# Patient Record
Sex: Female | Born: 1950 | Race: White | Hispanic: No | Marital: Married | Smoking: Former smoker
Health system: Southern US, Community
[De-identification: ages and names within clinical notes are randomized; demographics above are authoritative.]

## PROBLEM LIST (undated history)

## (undated) DIAGNOSIS — F32A Depression, unspecified: Secondary | ICD-10-CM

## (undated) DIAGNOSIS — K219 Gastro-esophageal reflux disease without esophagitis: Secondary | ICD-10-CM

## (undated) DIAGNOSIS — H269 Unspecified cataract: Secondary | ICD-10-CM

## (undated) DIAGNOSIS — Z800 Family history of malignant neoplasm of digestive organs: Secondary | ICD-10-CM

## (undated) DIAGNOSIS — Z803 Family history of malignant neoplasm of breast: Secondary | ICD-10-CM

## (undated) DIAGNOSIS — M1990 Unspecified osteoarthritis, unspecified site: Secondary | ICD-10-CM

## (undated) DIAGNOSIS — Z801 Family history of malignant neoplasm of trachea, bronchus and lung: Secondary | ICD-10-CM

## (undated) DIAGNOSIS — C50919 Malignant neoplasm of unspecified site of unspecified female breast: Secondary | ICD-10-CM

## (undated) DIAGNOSIS — D649 Anemia, unspecified: Secondary | ICD-10-CM

## (undated) DIAGNOSIS — I499 Cardiac arrhythmia, unspecified: Secondary | ICD-10-CM

## (undated) DIAGNOSIS — I498 Other specified cardiac arrhythmias: Secondary | ICD-10-CM

## (undated) HISTORY — DX: Malignant neoplasm of unspecified site of unspecified female breast: C50.919

## (undated) HISTORY — DX: Family history of malignant neoplasm of trachea, bronchus and lung: Z80.1

## (undated) HISTORY — DX: Unspecified cataract: H26.9

## (undated) HISTORY — DX: Family history of malignant neoplasm of digestive organs: Z80.0

## (undated) HISTORY — PX: EYE SURGERY: SHX253

## (undated) HISTORY — DX: Gastro-esophageal reflux disease without esophagitis: K21.9

## (undated) HISTORY — PX: TONSILLECTOMY: SUR1361

## (undated) HISTORY — DX: Family history of malignant neoplasm of breast: Z80.3

## (undated) HISTORY — DX: Depression, unspecified: F32.A

## (undated) HISTORY — PX: ROTATOR CUFF REPAIR: SHX139

## (undated) HISTORY — PX: OTHER SURGICAL HISTORY: SHX169

## (undated) HISTORY — PX: HERNIA REPAIR: SHX51

---

## 2017-12-16 HISTORY — PX: REPOSITION OF LENS: SHX6069

## 2021-02-13 DIAGNOSIS — C50911 Malignant neoplasm of unspecified site of right female breast: Secondary | ICD-10-CM | POA: Insufficient documentation

## 2021-05-17 NOTE — Progress Notes (Signed)
Patient called with referral to Radiation Oncology.  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  Phoned patient to define plan of care.  States she is planing to have surgery in [place], but would like to have radiation here.  Discussed Medical Oncology plan with patient.  She will be discussing treatment with her [place] provider tomorrow. Requested full notes and pathology to be faxed here in order to  schedule necessary consults.

## 2021-05-21 NOTE — Progress Notes (Signed)
Patient diagnosed with infiltrating ductal carcinoma 04/24/21 in [place].  She is having lumpectomy in July, in [place], and plans to have adjuvant treatment here at [facility].  She will call with date of return in order to schedule Med/Onc and Rad/Onc appointments. Records sent to be scanned into Media.

## 2021-06-19 DIAGNOSIS — N6342 Unspecified lump in left breast, subareolar: Secondary | ICD-10-CM | POA: Insufficient documentation

## 2021-07-16 DIAGNOSIS — Z9889 Other specified postprocedural states: Secondary | ICD-10-CM

## 2021-07-16 HISTORY — DX: Other specified postprocedural states: Z98.890

## 2021-10-02 DIAGNOSIS — C50919 Malignant neoplasm of unspecified site of unspecified female breast: Secondary | ICD-10-CM

## 2021-11-12 ENCOUNTER — Other Ambulatory Visit: Payer: Self-pay

## 2021-11-12 ENCOUNTER — Inpatient Hospital Stay: Payer: Medicare Other

## 2021-11-12 ENCOUNTER — Inpatient Hospital Stay: Payer: Medicare Other | Attending: Oncology | Admitting: Oncology

## 2021-11-12 ENCOUNTER — Encounter: Payer: Self-pay | Admitting: Oncology

## 2021-11-12 VITALS — BP 162/88 | HR 75 | Resp 17 | Wt 185.0 lb

## 2021-11-12 DIAGNOSIS — C50919 Malignant neoplasm of unspecified site of unspecified female breast: Secondary | ICD-10-CM | POA: Diagnosis present

## 2021-11-12 DIAGNOSIS — F32A Depression, unspecified: Secondary | ICD-10-CM | POA: Insufficient documentation

## 2021-11-12 DIAGNOSIS — Z803 Family history of malignant neoplasm of breast: Secondary | ICD-10-CM | POA: Diagnosis not present

## 2021-11-12 DIAGNOSIS — A4902 Methicillin resistant Staphylococcus aureus infection, unspecified site: Secondary | ICD-10-CM | POA: Insufficient documentation

## 2021-11-12 DIAGNOSIS — K219 Gastro-esophageal reflux disease without esophagitis: Secondary | ICD-10-CM | POA: Insufficient documentation

## 2021-11-12 DIAGNOSIS — Z170 Estrogen receptor positive status [ER+]: Secondary | ICD-10-CM | POA: Insufficient documentation

## 2021-11-12 DIAGNOSIS — Z87891 Personal history of nicotine dependence: Secondary | ICD-10-CM | POA: Diagnosis not present

## 2021-11-12 DIAGNOSIS — Z9013 Acquired absence of bilateral breasts and nipples: Secondary | ICD-10-CM | POA: Diagnosis not present

## 2021-11-12 DIAGNOSIS — Z791 Long term (current) use of non-steroidal anti-inflammatories (NSAID): Secondary | ICD-10-CM | POA: Insufficient documentation

## 2021-11-12 LAB — CBC WITH DIFFERENTIAL/PLATELET
Abs Immature Granulocytes: 0.01 10*3/uL (ref 0.00–0.07)
Basophils Absolute: 0.1 10*3/uL (ref 0.0–0.1)
Basophils Relative: 1 %
Eosinophils Absolute: 0.1 10*3/uL (ref 0.0–0.5)
Eosinophils Relative: 2 %
HCT: 36.3 % (ref 36.0–46.0)
Hemoglobin: 11.3 g/dL — ABNORMAL LOW (ref 12.0–15.0)
Immature Granulocytes: 0 %
Lymphocytes Relative: 35 %
Lymphs Abs: 1.7 10*3/uL (ref 0.7–4.0)
MCH: 26.3 pg (ref 26.0–34.0)
MCHC: 31.1 g/dL (ref 30.0–36.0)
MCV: 84.6 fL (ref 80.0–100.0)
Monocytes Absolute: 0.6 10*3/uL (ref 0.1–1.0)
Monocytes Relative: 12 %
Neutro Abs: 2.5 10*3/uL (ref 1.7–7.7)
Neutrophils Relative %: 50 %
Platelets: 299 10*3/uL (ref 150–400)
RBC: 4.29 MIL/uL (ref 3.87–5.11)
RDW: 13.9 % (ref 11.5–15.5)
WBC: 5 10*3/uL (ref 4.0–10.5)
nRBC: 0 % (ref 0.0–0.2)

## 2021-11-12 LAB — COMPREHENSIVE METABOLIC PANEL
ALT: 20 U/L (ref 0–44)
AST: 32 U/L (ref 15–41)
Albumin: 3.9 g/dL (ref 3.5–5.0)
Alkaline Phosphatase: 104 U/L (ref 38–126)
Anion gap: 9 (ref 5–15)
BUN: 22 mg/dL (ref 8–23)
CO2: 26 mmol/L (ref 22–32)
Calcium: 9.2 mg/dL (ref 8.9–10.3)
Chloride: 104 mmol/L (ref 98–111)
Creatinine, Ser: 0.81 mg/dL (ref 0.44–1.00)
GFR, Estimated: >60 mL/min (ref >60)
Glucose, Bld: 84 mg/dL (ref 70–99)
Potassium: 3.9 mmol/L (ref 3.5–5.1)
Sodium: 139 mmol/L (ref 135–145)
Total Bilirubin: 0.1 mg/dL — ABNORMAL LOW (ref 0.3–1.2)
Total Protein: 7.7 g/dL (ref 6.5–8.1)

## 2021-11-12 NOTE — Progress Notes (Signed)
Hematology/Oncology Consult note [phone] [phone]      Patient Care Team: Pcp, No as PCP - General  REFERRING PROVIDER: No ref. provider found  CHIEF COMPLAINTS/REASON FOR VISIT:  Evaluation of history of right breast cancer  HISTORY OF PRESENTING ILLNESS:   Janice Sanford is a  70 y.o.  female with PMH listed below was seen in consultation at the request of  No ref. provider found  for evaluation of right breast cancer  Patient lives in both [place] and [place]. Previous oncology care was in [place].  Reviewed pathology report of her right breast biopsy in May 2022. 04/24/2021, Site 1, 6:00 to 7:00 right breast, atypical ductal hyperplasia.  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 ER 90% positive, PR 90% positive, HER2 IHC negative  Patient has elected to have bilateral mastectomy.  Final mastectomy pathology report was not available to me. Patient has declined adjuvant endocrine therapy.  Menarche 70 years of age, Age of first childbirth Patient has history of hormone replacement therapy for 10+ years.  Currently off. OCP use, remote Denies any chest wall radiation.  Family history is positive for mother who was diagnosed with breast cancer in her 92s, father was diagnosed with lung cancer. No other cancer family history.   Review of Systems  Constitutional:  Negative for appetite change, chills, fatigue and fever.  HENT:   Negative for hearing loss and voice change.   Eyes:  Negative for eye problems.  Respiratory:  Negative for chest tightness and cough.   Cardiovascular:  Negative for chest pain.  Gastrointestinal:  Negative for abdominal distention, abdominal pain and blood in stool.  Endocrine: Negative for hot flashes.  Genitourinary:  Negative for difficulty urinating and frequency.    Musculoskeletal:  Negative for arthralgias.  Skin:  Negative for itching and rash.  Neurological:  Negative for extremity weakness.  Hematological:  Negative for adenopathy.  Psychiatric/Behavioral:  Negative for confusion.    MEDICAL HISTORY:  Past Medical History:  Diagnosis Date   Breast cancer ([place])    Cataract    Depression    GERD (gastroesophageal reflux disease)    S/P bilateral breast lumpectomy 07/2021    SURGICAL HISTORY: Past Surgical History:  Procedure Laterality Date   CESAREAN SECTION     3 c -section   HERNIA REPAIR     2005 and 2013   REPOSITION OF LENS  2019   ROTATOR CUFF REPAIR     2010 & 2012    SOCIAL HISTORY: Social History   Socioeconomic History   Marital status: Married    Spouse name: Not on file   Number of children: Not on file   Years of education: Not on file   Highest education level: Not on file  Occupational History   Not on file  Tobacco Use   Smoking status: Former    Types: Cigarettes    Quit date: 1984    Years since quitting: 38.9   Smokeless tobacco: Former  Substance and Sexual Activity   Alcohol use: Yes    Alcohol/week: 1.0 standard drink    Types: 1 Standard drinks or equivalent per week    Comment: ocassional   Drug use: Not Currently   Sexual activity: Yes  Other Topics Concern   Not on file  Social History Narrative   Not on file   Social Determinants of Health   Financial  Resource Strain: Not on file  Food Insecurity: Not on file  Transportation Needs: Not on file  Physical Activity: Not on file  Stress: Not on file  Social Connections: Not on file  Intimate Partner Violence: Not on file    FAMILY HISTORY: Family History  Problem Relation Age of Onset   Breast cancer Mother    Lung cancer Father    Other Sister    Parkinson's disease Brother     ALLERGIES:  is allergic to clindamycin.  MEDICATIONS:  Current Outpatient Medications  Medication Sig Dispense Refill   amoxicillin (AMOXIL)  500 MG capsule Take by mouth.     celecoxib (CELEBREX) 200 MG capsule celecoxib 200 mg capsule  TAKE 1 CAPSULE (200 MG) BY ORAL ROUTE ONCE DAILY     meloxicam (MOBIC) 15 MG tablet Take 15 mg by mouth daily.     pantoprazole (PROTONIX) 40 MG tablet Take 40 mg by mouth 2 (two) times daily.     venlafaxine XR (EFFEXOR-XR) 150 MG 24 hr capsule venlafaxine ER 150 mg capsule,extended release 24 hr     HYDROcodone-acetaminophen (NORCO) 10-325 MG tablet hydrocodone 10 mg-acetaminophen 325 mg tablet (Patient not taking: Reported on 11/12/2021)     No current facility-administered medications for this visit.     PHYSICAL EXAMINATION: ECOG PERFORMANCE STATUS: 0 - Asymptomatic Vitals:   11/12/21 1150  BP: (!) 162/88  Pulse: 75  Resp: 17  SpO2: 99%   Filed Weights   11/12/21 1150  Weight: 185 lb (83.9 kg)    Physical Exam Constitutional:      General: She is not in acute distress. HENT:     Head: Normocephalic and atraumatic.  Eyes:     General: No scleral icterus. Cardiovascular:     Rate and Rhythm: Normal rate and regular rhythm.     Heart sounds: Normal heart sounds.  Pulmonary:     Effort: Pulmonary effort is normal. No respiratory distress.     Breath sounds: No wheezing.  Abdominal:     General: Bowel sounds are normal. There is no distension.     Palpations: Abdomen is soft.  Musculoskeletal:        General: No deformity. Normal range of motion.     Cervical back: Normal range of motion and neck supple.  Skin:    General: Skin is warm and dry.     Findings: No erythema or rash.  Neurological:     Mental Status: She is alert and oriented to person, place, and time. Mental status is at baseline.     Cranial Nerves: No cranial nerve deficit.     Coordination: Coordination normal.  Psychiatric:        Mood and Affect: Mood normal.   Breast exam is performed in seated and lying down position. Patient is status post bilateral mastectomy with reconstruction. The implant  edges are intact and there is no evidence of any chest wall recurrence. No evidence of bilateral axillary adenopathy    LABORATORY DATA:  I have reviewed the data as listed Lab Results  Component Value Date   WBC 5.0 11/12/2021   HGB 11.3 (L) 11/12/2021   HCT 36.3 11/12/2021   MCV 84.6 11/12/2021   PLT 299 11/12/2021   Recent Labs    11/12/21 1228  NA 139  K 3.9  CL 104  CO2 26  GLUCOSE 84  BUN 22  CREATININE 0.81  CALCIUM 9.2  GFRNONAA >60  PROT 7.7  ALBUMIN 3.9  AST 32  ALT 20  ALKPHOS 104  BILITOT 0.1*   Iron/TIBC/Ferritin/ %Sat No results found for: IRON, TIBC, FERRITIN, IRONPCTSAT    RADIOGRAPHIC STUDIES: I have personally reviewed the radiological images as listed and agreed with the findings in the report. No results found.    ASSESSMENT & PLAN:  1. Invasive carcinoma of breast ([place])   2. Family history of breast cancer    #History of right breast invasive carcinoma, final mastectomy pathology was not available to me. Will obtain from her surgical oncologist clinic. Declined adjuvant endocrine therapy.  We discussed about the rationale and potential side effects. Recommend patient continue follow-up with surgical oncologist in [place]. Recommend patient to follow-up in 6 months.  Check CBC CMP today  Family history of breast cancer and personal history of breast cancer, Discussed with patient and recommend genetic testing.  She agrees.  Refer to Dietitian.  Orders Placed This Encounter  Procedures   CBC with Differential/Platelet    Standing Status:   Future    Number of Occurrences:   1    Standing Expiration Date:   11/12/2022   Comprehensive metabolic panel    Standing Status:   Future    Number of Occurrences:   1    Standing Expiration Date:   11/12/2022   CBC with Differential/Platelet    Standing Status:   Future    Standing Expiration Date:   11/12/2022   Comprehensive metabolic panel    Standing Status:   Future     Standing Expiration Date:   11/12/2022   Ambulatory referral to Genetics    Referral Priority:   Routine    Referral Type:   Consultation    Referral Reason:   Specialty Services Required    Number of Visits Requested:   1    All questions were answered. The patient knows to call the clinic with any problems questions or concerns.   No ref. provider found    Return of visit:  Thank you for this kind referral and the opportunity to participate in the care of this patient. A copy of today's note is routed to referring provider   Earlie Server, MD, PhD [facility] Hematology Oncology 11/12/2021

## 2021-11-12 NOTE — Progress Notes (Signed)
Patient here  to establish care/ at initial oncology appointment, expresses concerns of bilateral breast numbness

## 2021-11-21 ENCOUNTER — Inpatient Hospital Stay: Payer: Medicare Other | Attending: Oncology | Admitting: Licensed Clinical Social Worker

## 2021-11-21 ENCOUNTER — Inpatient Hospital Stay: Payer: Medicare Other

## 2021-11-29 ENCOUNTER — Inpatient Hospital Stay (HOSPITAL_BASED_OUTPATIENT_CLINIC_OR_DEPARTMENT_OTHER): Payer: Medicare Other | Admitting: Licensed Clinical Social Worker

## 2021-11-29 ENCOUNTER — Encounter: Payer: Self-pay | Admitting: Licensed Clinical Social Worker

## 2021-11-29 ENCOUNTER — Other Ambulatory Visit: Payer: Self-pay

## 2021-11-29 ENCOUNTER — Inpatient Hospital Stay: Payer: Medicare Other

## 2021-11-29 DIAGNOSIS — Z800 Family history of malignant neoplasm of digestive organs: Secondary | ICD-10-CM

## 2021-11-29 DIAGNOSIS — Z803 Family history of malignant neoplasm of breast: Secondary | ICD-10-CM

## 2021-11-29 DIAGNOSIS — C50911 Malignant neoplasm of unspecified site of right female breast: Secondary | ICD-10-CM

## 2021-11-29 DIAGNOSIS — Z801 Family history of malignant neoplasm of trachea, bronchus and lung: Secondary | ICD-10-CM

## 2021-11-29 NOTE — Progress Notes (Signed)
REFERRING PROVIDER: Earlie Server, MD [place],  [place] [id number]  PRIMARY PROVIDER:  Pcp, No  PRIMARY REASON FOR VISIT:  1. Infiltrating ductal carcinoma of right female breast ([place])   2. Family history of breast cancer   3. Family history of lung cancer   4. Family history of stomach cancer      HISTORY OF PRESENT ILLNESS:   Janice Sanford, a 70 y.o. female, was seen for a [place] cancer genetics consultation at the request of Dr. Tasia Catchings due to a personal and family history of cancer.  Janice Sanford presents to clinic today to discuss the possibility of a hereditary predisposition to cancer, genetic testing, and to further clarify her future cancer risks, as well as potential cancer risks for family members.   In 2022, at the age of 68, Janice Sanford was diagnosed with invasive ductal carcinoma w/ DCIS of the right breast, ER/PR+, HER2-. The treatment plan included bilateral mastectomy which was completed in [place].   CANCER HISTORY:  Oncology History   No history exists.     RISK FACTORS:  Menarche was at age 67.  First live birth at age 37.  OCP use: yes Ovaries intact: yes.  Hysterectomy: no.  Menopausal status: postmenopausal.  HRT use:  10+  years. Colonoscopy: yes; normal.  Past Medical History:  Diagnosis Date   Breast cancer ([place])    Cataract    Depression    Family history of breast cancer    Family history of lung cancer    Family history of stomach cancer    GERD (gastroesophageal reflux disease)    S/P bilateral breast lumpectomy 07/2021    Past Surgical History:  Procedure Laterality Date   CESAREAN SECTION     3 c -section   HERNIA REPAIR     2005 and 2013   REPOSITION OF LENS  2019   ROTATOR CUFF REPAIR     2010 & 2012    Social History   Socioeconomic History   Marital status: Married    Spouse name: Not on file   Number of children: Not on file   Years of education: Not on file   Highest education level: Not on file   Occupational History   Not on file  Tobacco Use   Smoking status: Former    Types: Cigarettes    Quit date: 1984    Years since quitting: 38.9   Smokeless tobacco: Former  Substance and Sexual Activity   Alcohol use: Yes    Alcohol/week: 1.0 standard drink    Types: 1 Standard drinks or equivalent per week    Comment: ocassional   Drug use: Not Currently   Sexual activity: Yes  Other Topics Concern   Not on file  Social History Narrative   Not on file   Social Determinants of Health   Financial Resource Strain: Not on file  Food Insecurity: Not on file  Transportation Needs: Not on file  Physical Activity: Not on file  Stress: Not on file  Social Connections: Not on file     FAMILY HISTORY:  We obtained a detailed, 4-generation family history.  Significant diagnoses are listed below: Family History  Problem Relation Age of Onset   Breast cancer Mother    Lung cancer Father    Other Sister    Parkinson's disease Brother    Janice Sanford has 3 daughters (40, 29, 9), none have had cancer. She has 2 brothers, 1 sister, and an  adopted brother and sister, none have had cancer.  Janice Sanford's mother had breast cancer at 51 and died at 36, she was an only child. Maternal grandmother had breast cancer at 38 and died at 62. Grandfather died of stroke 77-70.   Janice Sanford's father had lung cancer at 12 and died at 38, history of smoking. Patient had 2 paternal aunts. One died recently at 92 and her son died of stomach cancer at 27. Paternal grandfather died at 63, grandmother died at 60.  Janice Sanford is unaware of previous family history of genetic testing for hereditary cancer risks. Patient's maternal ancestors are of [place] descent, and paternal ancestors are of [place] descent. There is no reported Ashkenazi Jewish ancestry. There is no known consanguinity.   GENETIC COUNSELING ASSESSMENT: Janice Sanford is a 70 y.o. female with a personal and family history of breast  cancer which is somewhat suggestive of a hereditary cancer syndrome and predisposition to cancer. We, therefore, discussed and recommended the following at today's visit.   DISCUSSION: We discussed that approximately 10% of breast cancer is hereditary. Most cases of hereditary breast cancer are associated with BRCA1/BRCA2 genes, although there are other genes associated with hereditary cancer as well. Cancers and risks are gene specific.  We discussed that testing is beneficial for several reasons including knowing about other cancer risks, identifying potential screening and risk-reduction options that may be appropriate, and to understand if other family members could be at risk for cancer and allow them to undergo genetic testing.   We reviewed the characteristics, features and inheritance patterns of hereditary cancer syndromes. We also discussed genetic testing, including the appropriate family members to test, the process of testing, insurance coverage and turn-around-time for results. We discussed the implications of a negative, positive and/or variant of uncertain significant result. We recommended Janice Sanford pursue genetic testing for the [employer] CancerNext-Expanded+RNA gene panel.   The CancerNext-Expanded + RNAinsight gene panel offered by [employer] and includes sequencing and rearrangement analysis for the following 77 genes: IP, ALK, APC*, ATM*, AXIN2, BAP1, BARD1, BLM, BMPR1A, BRCA1*, BRCA2*, BRIP1*, CDC73, CDH1*,CDK4, CDKN1B, CDKN2A, CHEK2*, CTNNA1, DICER1, FANCC, FH, FLCN, GALNT12, KIF1B, LZTR1, MAX, MEN1, MET, MLH1*, MSH2*, MSH3, MSH6*, MUTYH*, NBN, NF1*, NF2, NTHL1, PALB2*, PHOX2B, PMS2*, POT1, PRKAR1A, PTCH1, PTEN*, RAD51C*, RAD51D*,RB1, RECQL, RET, SDHA, SDHAF2, SDHB, SDHC, SDHD, SMAD4, SMARCA4, SMARCB1, SMARCE1, STK11, SUFU, TMEM127, TP53*,TSC1, TSC2, VHL and XRCC2 (sequencing and deletion/duplication); EGFR, EGLN1, HOXB13, KIT, MITF, PDGFRA, POLD1 and POLE (sequencing only); EPCAM  and GREM1 (deletion/duplication only).  Based on Janice Sanford's personal and family history of cancer, she meets medical criteria for genetic testing. Despite that she meets criteria, she may still have an out of pocket cost. We discussed that if her out of pocket cost for testing is over $100, the laboratory will call and confirm whether she wants to proceed with testing.  If the out of pocket cost of testing is less than $100 she will be billed by the genetic testing laboratory.   PLAN: After considering the risks, benefits, and limitations, Janice Sanford provided informed consent to pursue genetic testing and the blood sample was sent to [facility] for analysis of the CancerNext-Expanded+RNA panel. Results should be available within approximately 2-3 weeks' time, at which point they will be disclosed by telephone to Janice Sanford, as will any additional recommendations warranted by these results. Janice Sanford will receive a summary of her genetic counseling visit and a copy of her results once available. This information will also  be available in Epic.   Janice Sanford's questions were answered to her satisfaction today. Our contact information was provided should additional questions or concerns arise. Thank you for the referral and allowing [place] to share in the care of your patient.   Faith Rogue, MS, [facility] Genetic Counselor [email or web address] Phone: [phone]  The patient was seen for a total of 25 minutes in face-to-face genetic counseling.  Patient was seen alone.  Dr. Grayland Ormond was available for discussion regarding this case.   _______________________________________________________________________ For Office Staff:  Number of people involved in session: 1 Was an Intern/ student involved with case: no

## 2021-12-18 ENCOUNTER — Telehealth: Payer: Self-pay | Admitting: Licensed Clinical Social Worker

## 2021-12-25 ENCOUNTER — Encounter: Payer: Self-pay | Admitting: Licensed Clinical Social Worker

## 2021-12-25 ENCOUNTER — Ambulatory Visit: Payer: Self-pay | Admitting: Licensed Clinical Social Worker

## 2021-12-25 DIAGNOSIS — Z1379 Encounter for other screening for genetic and chromosomal anomalies: Secondary | ICD-10-CM

## 2021-12-25 NOTE — Telephone Encounter (Signed)
Revealed negative genetic testing.  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 This normal result is reassuring and indicates that it is unlikely Janice Sanford's cancer is due to a hereditary cause.  It is unlikely that there is an increased risk of another cancer due to a mutation in one of these genes.  However, genetic testing is not perfect, and cannot definitively rule out a hereditary cause.  It will be important for her to keep in contact with genetics to learn if any additional testing may be needed in the future.

## 2021-12-25 NOTE — Progress Notes (Signed)
HPI:  [name] was previously seen in the [facility] clinic due to a personal and family history of cancer and concerns regarding a hereditary predisposition to cancer. Please refer to our prior cancer genetics clinic note for more information regarding our discussion, assessment and recommendations, at the time. [name]'s recent genetic test results were disclosed to her, as were recommendations warranted by these results. These results and recommendations are discussed in more detail below.  CANCER HISTORY:  Oncology History   No history exists.    FAMILY HISTORY:  We obtained a detailed, 4-generation family history.  Significant diagnoses are listed below: Family History  Problem Relation Age of Onset   Breast cancer Mother    Lung cancer Father    Other Sister    Parkinson's disease Brother     [name] has 3 daughters (67, 47, 44), none have had cancer. She has 2 brothers, 1 sister, and an adopted brother and sister, none have had cancer.   [name]'s mother had breast cancer at 75 and died at 60, she was an only child. Maternal grandmother had breast cancer at 59 and died at 34. Grandfather died of stroke 85-70.    [name]'s father had lung cancer at 36 and died at 62, history of smoking. Patient had 2 paternal aunts. One died recently at 71 and her son died of stomach cancer at 41. Paternal grandfather died at 46, grandmother died at 36.   Janice Sanford is unaware of previous family history of genetic testing for hereditary cancer risks. Patient's maternal ancestors are of [place] descent, and paternal ancestors are of German/Irish descent. There is no reported Ashkenazi Jewish ancestry. There is no known consanguinity.     GENETIC TEST RESULTS: Genetic testing reported out on 12/13/2021 through the [facility] CancerNext-Expanded+RNA cancer panel found no pathogenic mutations.   The CancerNext-Expanded + RNAinsight gene panel offered by [employer] and includes sequencing and rearrangement analysis for the following 77 genes: IP, ALK, APC*, ATM*, AXIN2, BAP1, BARD1, BLM, BMPR1A, BRCA1*, BRCA2*, BRIP1*, CDC73, CDH1*,CDK4, CDKN1B, CDKN2A, CHEK2*, CTNNA1, DICER1, FANCC, FH, FLCN, GALNT12, KIF1B, LZTR1, MAX, MEN1, MET, MLH1*, MSH2*, MSH3, MSH6*, MUTYH*, NBN, NF1*, NF2, NTHL1, PALB2*, PHOX2B, PMS2*, POT1, PRKAR1A, PTCH1, PTEN*, RAD51C*, RAD51D*,RB1, RECQL, RET, SDHA, SDHAF2, SDHB, SDHC, SDHD, SMAD4, SMARCA4, SMARCB1, SMARCE1, STK11, SUFU, TMEM127, TP53*,TSC1, TSC2, VHL and XRCC2 (sequencing and deletion/duplication); EGFR, EGLN1, HOXB13, KIT, MITF, PDGFRA, POLD1 and POLE (sequencing only); EPCAM and GREM1 (deletion/duplication only).   The test report has been scanned into EPIC and is located under the Molecular Pathology section of the Results Review tab.  A portion of the result report is included below for reference.      We discussed that because current genetic testing is not perfect, it is possible there may be a gene mutation in one of these genes that current testing cannot detect, but that chance is small.  There could be another gene that has not yet been discovered, or that we have not yet tested, that is responsible for the cancer diagnoses in the family. It is also possible there is a hereditary cause for the cancer in the family that [name] did not inherit and therefore was not identified in her testing.  Therefore, it is important to remain in touch with cancer genetics in the future so that we can continue to offer [name] the most up to date genetic testing.   Genetic testing did identify a variant of uncertain significance (  VUS) in the DICER1 gene called [id number].[id number].  At this time, it is unknown if this variant is associated with increased cancer risk or if this is a normal finding, but most variants such as this get reclassified to being inconsequential. It should not be used to make medical management decisions. With  time, we suspect the lab will determine the significance of this variant, if any. If we do learn more about it we will try to contact [name] to discuss it further. However, it is important to stay in touch with [place] periodically and keep the address and phone number up to date.  ADDITIONAL GENETIC TESTING:We discussed with [name] that her genetic testing was fairly extensive.  If there are genes identified to increase cancer risk that can be analyzed in the future, we would be happy to discuss and coordinate this testing at that time.    CANCER SCREENING RECOMMENDATIONS: [name]'s test result is considered negative (normal).  This means that we have not identified a hereditary cause for her  personal and family history of cancer at this time. Most cancers happen by chance and this negative test suggests that her cancer may fall into this category.    While reassuring, this does not definitively rule out a hereditary predisposition to cancer. It is still possible that there could be genetic mutations that are undetectable by current technology. There could be genetic mutations in genes that have not been tested or identified to increase cancer risk.  Therefore, it is recommended she continue to follow the cancer management and screening guidelines provided by her oncology and primary healthcare provider.   An individual's cancer risk and medical management are not determined by genetic test results alone. Overall cancer risk assessment incorporates additional factors, including personal medical history, family history, and any available genetic information that may result in a personalized plan for cancer prevention and surveillance.  RECOMMENDATIONS FOR FAMILY MEMBERS:  Relatives in this family might be at some increased risk of developing cancer, over the general population risk, simply due to the family history of cancer.  We recommended female relatives in this family have a yearly  mammogram beginning at age 43, or 90 years younger than the earliest onset of cancer, an annual clinical breast exam, and perform monthly breast self-exams. Female relatives in this family should also have a gynecological exam as recommended by their primary provider.  All family members should be referred for colonoscopy starting at age 67.   It is also possible there is a hereditary cause for the cancer in [name]'s family that she did not inherit and therefore was not identified in her.  Based on [name]'s family history, we recommended those related to her maternal grandmother who had breast cancer in her 22s have genetic counseling and testing. [name] will let us know if we can be of any assistance in coordinating genetic counseling and/or testing for these family members.  FOLLOW-UP: Lastly, we discussed with [name] that cancer genetics is a rapidly advancing field and it is possible that new genetic tests will be appropriate for her and/or her family members in the future. We encouraged her to remain in contact with cancer genetics on an annual basis so we can update her personal and family histories and let her know of advances in cancer genetics that may benefit this family.   Our contact number was provided. [name]'s questions were answered to her satisfaction, and she knows she is welcome to  call us at anytime with additional questions or concerns.   Faith Rogue, MS, [facility] Genetic Counselor [email or web address] Phone: [phone]

## 2022-01-02 NOTE — Progress Notes (Signed)
Patient phoned stating she needs cardiac clearance before she can be scheduled for reconstruction  with her surgeon in Dr. Amie Portland in [place].  Message sent to Dr. Tasia Catchings.

## 2022-01-22 ENCOUNTER — Other Ambulatory Visit
Admission: RE | Admit: 2022-01-22 | Discharge: 2022-01-22 | Disposition: A | Discharge status: Home / Self Care | Payer: Medicare Other | Source: Home / Self Care | Attending: *Deleted | Admitting: *Deleted

## 2022-01-22 ENCOUNTER — Other Ambulatory Visit: Payer: Self-pay

## 2022-01-22 ENCOUNTER — Ambulatory Visit
Admission: RE | Admit: 2022-01-22 | Discharge: 2022-01-22 | Disposition: A | Discharge status: Home / Self Care | Payer: Medicare Other | Source: Ambulatory Visit | Attending: Plastic Surgery | Admitting: Plastic Surgery

## 2022-01-22 ENCOUNTER — Ambulatory Visit
Admission: RE | Admit: 2022-01-22 | Discharge: 2022-01-22 | Disposition: A | Discharge status: Home / Self Care | Payer: Medicare Other | Attending: Plastic Surgery | Admitting: Plastic Surgery

## 2022-01-22 ENCOUNTER — Other Ambulatory Visit: Payer: Self-pay | Admitting: Plastic Surgery

## 2022-01-22 DIAGNOSIS — N62 Hypertrophy of breast: Secondary | ICD-10-CM

## 2022-01-22 DIAGNOSIS — C50111 Malignant neoplasm of central portion of right female breast: Secondary | ICD-10-CM

## 2022-01-22 DIAGNOSIS — R21 Rash and other nonspecific skin eruption: Secondary | ICD-10-CM

## 2022-01-22 LAB — CBC
HCT: 37.6 % (ref 36.0–46.0)
Hemoglobin: 11.6 g/dL — ABNORMAL LOW (ref 12.0–15.0)
MCH: 24.4 pg — ABNORMAL LOW (ref 26.0–34.0)
MCHC: 30.9 g/dL (ref 30.0–36.0)
MCV: 79.2 fL — ABNORMAL LOW (ref 80.0–100.0)
Platelets: 356 10*3/uL (ref 150–400)
RBC: 4.75 MIL/uL (ref 3.87–5.11)
RDW: 14.6 % (ref 11.5–15.5)
WBC: 5 10*3/uL (ref 4.0–10.5)
nRBC: 0 % (ref 0.0–0.2)

## 2022-01-22 LAB — BASIC METABOLIC PANEL
Anion gap: 6 (ref 5–15)
BUN: 21 mg/dL (ref 8–23)
CO2: 26 mmol/L (ref 22–32)
Calcium: 9.7 mg/dL (ref 8.9–10.3)
Chloride: 105 mmol/L (ref 98–111)
Creatinine, Ser: 1.06 mg/dL — ABNORMAL HIGH (ref 0.44–1.00)
GFR, Estimated: 57 mL/min — ABNORMAL LOW (ref >60)
Glucose, Bld: 120 mg/dL — ABNORMAL HIGH (ref 70–99)
Potassium: 4.6 mmol/L (ref 3.5–5.1)
Sodium: 137 mmol/L (ref 135–145)

## 2022-05-14 ENCOUNTER — Encounter: Payer: Self-pay | Admitting: Oncology

## 2022-05-14 ENCOUNTER — Inpatient Hospital Stay (HOSPITAL_BASED_OUTPATIENT_CLINIC_OR_DEPARTMENT_OTHER): Payer: Medicare Other | Admitting: Oncology

## 2022-05-14 ENCOUNTER — Inpatient Hospital Stay: Payer: Medicare Other | Attending: Oncology

## 2022-05-14 VITALS — BP 152/58 | HR 70 | Temp 96.7°F | Resp 16 | Wt 163.9 lb

## 2022-05-14 DIAGNOSIS — Z9013 Acquired absence of bilateral breasts and nipples: Secondary | ICD-10-CM | POA: Insufficient documentation

## 2022-05-14 DIAGNOSIS — Z809 Family history of malignant neoplasm, unspecified: Secondary | ICD-10-CM

## 2022-05-14 DIAGNOSIS — N644 Mastodynia: Secondary | ICD-10-CM

## 2022-05-14 DIAGNOSIS — Z801 Family history of malignant neoplasm of trachea, bronchus and lung: Secondary | ICD-10-CM | POA: Diagnosis not present

## 2022-05-14 DIAGNOSIS — Z170 Estrogen receptor positive status [ER+]: Secondary | ICD-10-CM | POA: Diagnosis not present

## 2022-05-14 DIAGNOSIS — C50919 Malignant neoplasm of unspecified site of unspecified female breast: Secondary | ICD-10-CM

## 2022-05-14 DIAGNOSIS — C50911 Malignant neoplasm of unspecified site of right female breast: Secondary | ICD-10-CM | POA: Insufficient documentation

## 2022-05-14 DIAGNOSIS — Z803 Family history of malignant neoplasm of breast: Secondary | ICD-10-CM | POA: Diagnosis not present

## 2022-05-14 LAB — CBC WITH DIFFERENTIAL/PLATELET
Abs Immature Granulocytes: 0.01 10*3/uL (ref 0.00–0.07)
Basophils Absolute: 0 10*3/uL (ref 0.0–0.1)
Basophils Relative: 1 %
Eosinophils Absolute: 0.1 10*3/uL (ref 0.0–0.5)
Eosinophils Relative: 2 %
HCT: 38.1 % (ref 36.0–46.0)
Hemoglobin: 11.9 g/dL — ABNORMAL LOW (ref 12.0–15.0)
Immature Granulocytes: 0 %
Lymphocytes Relative: 45 %
Lymphs Abs: 2.1 10*3/uL (ref 0.7–4.0)
MCH: 25.4 pg — ABNORMAL LOW (ref 26.0–34.0)
MCHC: 31.2 g/dL (ref 30.0–36.0)
MCV: 81.2 fL (ref 80.0–100.0)
Monocytes Absolute: 0.5 10*3/uL (ref 0.1–1.0)
Monocytes Relative: 11 %
Neutro Abs: 1.8 10*3/uL (ref 1.7–7.7)
Neutrophils Relative %: 41 %
Platelets: 208 10*3/uL (ref 150–400)
RBC: 4.69 MIL/uL (ref 3.87–5.11)
RDW: 17.2 % — ABNORMAL HIGH (ref 11.5–15.5)
WBC: 4.5 10*3/uL (ref 4.0–10.5)
nRBC: 0 % (ref 0.0–0.2)

## 2022-05-14 LAB — COMPREHENSIVE METABOLIC PANEL
ALT: 15 U/L (ref 0–44)
AST: 23 U/L (ref 15–41)
Albumin: 4.2 g/dL (ref 3.5–5.0)
Alkaline Phosphatase: 102 U/L (ref 38–126)
Anion gap: 6 (ref 5–15)
BUN: 25 mg/dL — ABNORMAL HIGH (ref 8–23)
CO2: 27 mmol/L (ref 22–32)
Calcium: 9.5 mg/dL (ref 8.9–10.3)
Chloride: 106 mmol/L (ref 98–111)
Creatinine, Ser: 0.97 mg/dL (ref 0.44–1.00)
GFR, Estimated: >60 mL/min (ref >60)
Glucose, Bld: 86 mg/dL (ref 70–99)
Potassium: 4.2 mmol/L (ref 3.5–5.1)
Sodium: 139 mmol/L (ref 135–145)
Total Bilirubin: 0.3 mg/dL (ref 0.3–1.2)
Total Protein: 7.5 g/dL (ref 6.5–8.1)

## 2022-05-14 NOTE — Progress Notes (Unsigned)
Pt in for 6 month follow up, states she is having some pain in outer right breast that is intermittent.  Pt also reports since have implant surgery she has developed some cardiac issues and recently had to wear a holter monitor and has a stress test scheduled for June 14, 2022.

## 2022-05-15 NOTE — Progress Notes (Signed)
Hematology/Oncology Progress note [phone] [phone]         Patient Care Team: Pcp, No as PCP - General  REFERRING PROVIDER: No ref. provider found  CHIEF COMPLAINTS/REASON FOR VISIT:  Evaluation of history of right breast cancer  HISTORY OF PRESENTING ILLNESS:   Janice Sanford is a  71 y.o.  female with PMH listed below was seen in consultation at the request of  No ref. provider found  for evaluation of right breast cancer  Patient lives in both [place] and [place]. Previous oncology care was in [place].  Reviewed pathology report of her right breast biopsy in May 2022. 04/24/2021, Site 1, 6:00 to 7:00 right breast, atypical ductal hyperplasia.  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 ER 90% positive, PR 90% positive, HER2 IHC negative  Patient has elected to have bilateral mastectomy.  Final mastectomy pathology report was not available to me. Patient has declined adjuvant endocrine therapy.  Menarche 71 years of age, Age of first childbirth Patient has history of hormone replacement therapy for 10+ years.  Currently off. OCP use, remote Denies any chest wall radiation.  Family history is positive for mother who was diagnosed with breast cancer in her 91s, father was diagnosed with lung cancer. No other cancer family history.  INTERVAL HISTORY Janice Sanford is a 71 y.o. female who has above history reviewed by me today presents for follow up visit for management of history of right breast cancer. Patient complains right breast sharp pain, intermittent, spontaneously resolves.  She has no other breast complaints.  Denies any new complaints. She is now retired and resides primarily in [place].  She had a telemetry medicine with her surgeon.   03/13/2022 she had bilateral revisions of reconstructed  breasts with exchange of TE for saline implants She also had skin biopsy of the right anterior shoulder and skin of the right upper back.  Pathology showed actinic keratosis, bowenoid, no malignancy in the sections examined.   Review of Systems  Constitutional:  Negative for appetite change, chills, fatigue and fever.  HENT:   Negative for hearing loss and voice change.   Eyes:  Negative for eye problems.  Respiratory:  Negative for chest tightness and cough.   Cardiovascular:  Negative for chest pain.  Gastrointestinal:  Negative for abdominal distention, abdominal pain and blood in stool.  Endocrine: Negative for hot flashes.  Genitourinary:  Negative for difficulty urinating and frequency.   Musculoskeletal:  Negative for arthralgias.  Skin:  Negative for itching and rash.  Neurological:  Negative for extremity weakness.  Hematological:  Negative for adenopathy.  Psychiatric/Behavioral:  Negative for confusion.    MEDICAL HISTORY:  Past Medical History:  Diagnosis Date   Breast cancer ([place])    Cataract    Depression    Family history of breast cancer    Family history of lung cancer    Family history of stomach cancer    GERD (gastroesophageal reflux disease)    S/P bilateral breast lumpectomy 07/2021    SURGICAL HISTORY: Past Surgical History:  Procedure Laterality Date   CESAREAN SECTION     3 c -section   HERNIA REPAIR     2005 and 2013   REPOSITION OF LENS  2019   ROTATOR CUFF REPAIR     2010 & 2012    SOCIAL HISTORY: Social History   Socioeconomic History   Marital  status: Married    Spouse name: Not on file   Number of children: Not on file   Years of education: Not on file   Highest education level: Not on file  Occupational History   Not on file  Tobacco Use   Smoking status: Former    Types: Cigarettes    Quit date: 1984    Years since quitting: 39.4   Smokeless tobacco: Former  Substance and Sexual Activity   Alcohol use: Yes     Alcohol/week: 1.0 standard drink    Types: 1 Standard drinks or equivalent per week    Comment: ocassional   Drug use: Not Currently   Sexual activity: Yes  Other Topics Concern   Not on file  Social History Narrative   Not on file   Social Determinants of Health   Financial Resource Strain: Not on file  Food Insecurity: Not on file  Transportation Needs: Not on file  Physical Activity: Not on file  Stress: Not on file  Social Connections: Not on file  Intimate Partner Violence: Not on file    FAMILY HISTORY: Family History  Problem Relation Age of Onset   Breast cancer Mother    Lung cancer Father    Other Sister    Parkinson's disease Brother     ALLERGIES:  is allergic to clindamycin.  MEDICATIONS:  Current Outpatient Medications  Medication Sig Dispense Refill   celecoxib (CELEBREX) 200 MG capsule celecoxib 200 mg capsule  TAKE 1 CAPSULE (200 MG) BY ORAL ROUTE ONCE DAILY     pantoprazole (PROTONIX) 40 MG tablet Take 40 mg by mouth 2 (two) times daily.     venlafaxine XR (EFFEXOR-XR) 150 MG 24 hr capsule venlafaxine ER 150 mg capsule,extended release 24 hr     No current facility-administered medications for this visit.     PHYSICAL EXAMINATION: ECOG PERFORMANCE STATUS: 0 - Asymptomatic Vitals:   05/14/22 1409  BP: (!) 152/58  Pulse: 70  Resp: 16  Temp: (!) 96.7 F (35.9 C)  SpO2: 100%   Filed Weights   05/14/22 1409  Weight: 163 lb 14.4 oz (74.3 kg)    Physical Exam Constitutional:      General: She is not in acute distress. HENT:     Head: Normocephalic and atraumatic.  Eyes:     General: No scleral icterus. Cardiovascular:     Rate and Rhythm: Normal rate and regular rhythm.     Heart sounds: Normal heart sounds.  Pulmonary:     Effort: Pulmonary effort is normal. No respiratory distress.     Breath sounds: No wheezing.  Abdominal:     General: Bowel sounds are normal. There is no distension.     Palpations: Abdomen is soft.   Musculoskeletal:        General: No deformity. Normal range of motion.     Cervical back: Normal range of motion and neck supple.  Skin:    General: Skin is warm and dry.     Findings: No erythema or rash.  Neurological:     Mental Status: She is alert and oriented to person, place, and time. Mental status is at baseline.     Cranial Nerves: No cranial nerve deficit.     Coordination: Coordination normal.  Psychiatric:        Mood and Affect: Mood normal.   Breast exam is performed in seated and lying down position. Patient is status post bilateral mastectomy with reconstruction. The implant edges are intact and  there is no evidence of any chest wall recurrence. No evidence of bilateral axillary adenopathy    LABORATORY DATA:  I have reviewed the data as listed Lab Results  Component Value Date   WBC 4.5 05/14/2022   HGB 11.9 (L) 05/14/2022   HCT 38.1 05/14/2022   MCV 81.2 05/14/2022   PLT 208 05/14/2022   Recent Labs    11/12/21 1228 01/22/22 1515 05/14/22 1329  NA 139 137 139  K 3.9 4.6 4.2  CL 104 105 106  CO2 '26 26 27  ' GLUCOSE 84 120* 86  BUN 22 21 25*  CREATININE 0.81 1.06* 0.97  CALCIUM 9.2 9.7 9.5  GFRNONAA >60 57* >60  PROT 7.7  --  7.5  ALBUMIN 3.9  --  4.2  AST 32  --  23  ALT 20  --  15  ALKPHOS 104  --  102  BILITOT 0.1*  --  0.3    Iron/TIBC/Ferritin/ %Sat No results found for: IRON, TIBC, FERRITIN, IRONPCTSAT    RADIOGRAPHIC STUDIES: I have personally reviewed the radiological images as listed and agreed with the findings in the report. No results found.    ASSESSMENT & PLAN:  1. Infiltrating ductal carcinoma of right female breast ([place])   2. Family history of cancer   3. Breast pain, right    #History of right breast invasive carcinoma, ER positive, PR positive, HER2 negative.  Status post bilateral mastectomy with reconstruction.  Final mastectomy pathology was not available to me. Declined endocrine therapy. Labs reviewed and  discussed with patient. 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 I recommend patient to establish care with plastic surgeon.  Family history of breast cancer.  Patient has discussed with genetic counselor.  11/29/21 [facility] showed DICER1 VUS.  No other pathologic mutations.  Orders Placed This Encounter  Procedures   CBC with Differential/Platelet    Standing Status:   Future    Standing Expiration Date:   05/15/2023   Comprehensive metabolic panel    Standing Status:   Future    Standing Expiration Date:   05/15/2023   Ambulatory referral to Plastic Surgery    Referral Priority:   Routine    Referral Type:   Surgical    Referral Reason:   Specialty Services Required    Referred to Provider:   Wallace Going, DO    Requested Specialty:   Plastic Surgery    Number of Visits Requested:   1    All questions were answered. The patient knows to call the clinic with any problems questions or concerns.    Return of visit: Follow-up in 6 months. Thank you for this kind referral and the opportunity to participate in the care of this patient. A copy of today's note is routed to referring provider   Earlie Server, MD, PhD [facility] Hematology Oncology 05/15/2022

## 2022-08-20 ENCOUNTER — Encounter: Payer: Self-pay | Admitting: Plastic Surgery

## 2022-08-20 ENCOUNTER — Ambulatory Visit (INDEPENDENT_AMBULATORY_CARE_PROVIDER_SITE_OTHER): Payer: Medicare Other | Admitting: Plastic Surgery

## 2022-08-20 VITALS — BP 149/85 | HR 70 | Ht 70.0 in | Wt 158.0 lb

## 2022-08-20 DIAGNOSIS — C50112 Malignant neoplasm of central portion of left female breast: Secondary | ICD-10-CM | POA: Diagnosis not present

## 2022-08-20 DIAGNOSIS — C50911 Malignant neoplasm of unspecified site of right female breast: Secondary | ICD-10-CM

## 2022-08-20 DIAGNOSIS — N6342 Unspecified lump in left breast, subareolar: Secondary | ICD-10-CM

## 2022-08-20 NOTE — Progress Notes (Signed)
Patient ID: Janice Sanford, female    DOB: [birth year], 71 y.o.   MRN: [id number]   Chief Complaint  Patient presents with   Consult   Breast Problem    The  patient is a 71 yrs old female here for a consultation for breast reconstruction.  She received oncology care in the past in [place] in 2022 for a well differentiated infiltrating ductal carcinoma DCIS of the right breast.  It was estrogen and progesterone positive and HER2 negative.  The patient opted for bilateral mastectomies.  She is being seen by Dr. Tasia Catchings here in [place].  She complains of intermittent sharp pain in the right breast.  In March she had removal of the expanders and placement of saline implants.  The patient has her implant card.  It shows a left Mentor smooth round moderate plus profile saline implant 700 cc (reference #[id number]).  A right Mentor smooth round moderate plus profile saline 650 cc (Reference [id number]).  She is overall happy with her reconstruction but states that she is bigger than she wanted to be.  I feel no areas of concern.  Implants are intact.  I do not know the location of the implants as to whether they are above or below the muscle.  We will get a release of information to have that information available to [place].       Review of Systems  Constitutional: Negative.   Eyes: Negative.   Respiratory: Negative.    Cardiovascular: Negative.   Gastrointestinal: Negative.   Endocrine: Negative.   Genitourinary: Negative.   Musculoskeletal: Negative.   Skin: Negative.   Allergic/Immunologic: Negative.     Past Medical History:  Diagnosis Date   Breast cancer ([place])    Cataract    Depression    Family history of breast cancer    Family history of lung cancer    Family history of stomach cancer    GERD (gastroesophageal reflux disease)    S/P bilateral breast lumpectomy 07/2021    Past Surgical History:  Procedure Laterality Date   CESAREAN SECTION     3 c -section   HERNIA REPAIR      2005 and 2013   REPOSITION OF LENS  2019   ROTATOR CUFF REPAIR     2010 & 2012      Current Outpatient Medications:    celecoxib (CELEBREX) 200 MG capsule, celecoxib 200 mg capsule  TAKE 1 CAPSULE (200 MG) BY ORAL ROUTE ONCE DAILY, Disp: , Rfl:    pantoprazole (PROTONIX) 40 MG tablet, Take 40 mg by mouth 2 (two) times daily., Disp: , Rfl:    venlafaxine XR (EFFEXOR-XR) 150 MG 24 hr capsule, venlafaxine ER 150 mg capsule,extended release 24 hr, Disp: , Rfl:    Objective:   Vitals:   08/20/22 1030  BP: (!) 149/85  Pulse: 70  SpO2: 97%    Physical Exam Vitals and nursing note reviewed.  Constitutional:      Appearance: Normal appearance.  HENT:     Head: Normocephalic.  Cardiovascular:     Rate and Rhythm: Normal rate.     Pulses: Normal pulses.  Pulmonary:     Effort: Pulmonary effort is normal. No respiratory distress.  Abdominal:     General: There is no distension.     Palpations: Abdomen is soft.  Musculoskeletal:        General: Normal range of motion.  Skin:    General: Skin is warm.  Capillary Refill: Capillary refill takes less than 2 seconds.     Coloration: Skin is not jaundiced.  Neurological:     Mental Status: She is alert and oriented to person, place, and time.  Psychiatric:        Mood and Affect: Mood normal.        Behavior: Behavior normal.        Thought Content: Thought content normal.        Judgment: Judgment normal.     Assessment & Plan:  Lump in central portion of left breast  Infiltrating ductal carcinoma of right female breast ([facility])  Malignant neoplasm of central portion of left female breast, unspecified estrogen receptor status ([place])  Plan to see the patient back in 1 year for follow-up.  She can certainly call if she has any questions or concerns.  Pictures were obtained of the patient and placed in the chart with the patient's or guardian's permission.   [place], DO

## 2022-08-28 ENCOUNTER — Telehealth: Payer: Self-pay

## 2022-08-28 NOTE — Telephone Encounter (Signed)
Faxed records request from [facility], Dr. Richardson Dopp [phone]) as pt moved from [place] and is here to establish care for hx of breast reconstruction.   Confirmed receipt of fax.

## 2022-11-14 ENCOUNTER — Inpatient Hospital Stay (HOSPITAL_BASED_OUTPATIENT_CLINIC_OR_DEPARTMENT_OTHER): Payer: Medicare Other | Admitting: Oncology

## 2022-11-14 ENCOUNTER — Encounter: Payer: Self-pay | Admitting: Oncology

## 2022-11-14 ENCOUNTER — Inpatient Hospital Stay: Payer: Medicare Other | Attending: Oncology

## 2022-11-14 VITALS — BP 175/91 | HR 74 | Temp 96.7°F | Wt 162.1 lb

## 2022-11-14 DIAGNOSIS — Z853 Personal history of malignant neoplasm of breast: Secondary | ICD-10-CM | POA: Insufficient documentation

## 2022-11-14 DIAGNOSIS — Z803 Family history of malignant neoplasm of breast: Secondary | ICD-10-CM

## 2022-11-14 DIAGNOSIS — D509 Iron deficiency anemia, unspecified: Secondary | ICD-10-CM | POA: Diagnosis present

## 2022-11-14 DIAGNOSIS — Z9013 Acquired absence of bilateral breasts and nipples: Secondary | ICD-10-CM | POA: Diagnosis not present

## 2022-11-14 DIAGNOSIS — C50911 Malignant neoplasm of unspecified site of right female breast: Secondary | ICD-10-CM

## 2022-11-14 DIAGNOSIS — Z87891 Personal history of nicotine dependence: Secondary | ICD-10-CM | POA: Diagnosis not present

## 2022-11-14 DIAGNOSIS — Z801 Family history of malignant neoplasm of trachea, bronchus and lung: Secondary | ICD-10-CM | POA: Insufficient documentation

## 2022-11-14 LAB — CBC WITH DIFFERENTIAL/PLATELET
Abs Immature Granulocytes: 0.01 10*3/uL (ref 0.00–0.07)
Basophils Absolute: 0.1 10*3/uL (ref 0.0–0.1)
Basophils Relative: 2 %
Eosinophils Absolute: 0.1 10*3/uL (ref 0.0–0.5)
Eosinophils Relative: 3 %
HCT: 37 % (ref 36.0–46.0)
Hemoglobin: 11.7 g/dL — ABNORMAL LOW (ref 12.0–15.0)
Immature Granulocytes: 0 %
Lymphocytes Relative: 37 %
Lymphs Abs: 1.5 10*3/uL (ref 0.7–4.0)
MCH: 25.8 pg — ABNORMAL LOW (ref 26.0–34.0)
MCHC: 31.6 g/dL (ref 30.0–36.0)
MCV: 81.5 fL (ref 80.0–100.0)
Monocytes Absolute: 0.5 10*3/uL (ref 0.1–1.0)
Monocytes Relative: 11 %
Neutro Abs: 2 10*3/uL (ref 1.7–7.7)
Neutrophils Relative %: 47 %
Platelets: 224 10*3/uL (ref 150–400)
RBC: 4.54 MIL/uL (ref 3.87–5.11)
RDW: 16.4 % — ABNORMAL HIGH (ref 11.5–15.5)
WBC: 4.1 10*3/uL (ref 4.0–10.5)
nRBC: 0 % (ref 0.0–0.2)

## 2022-11-14 LAB — COMPREHENSIVE METABOLIC PANEL
ALT: 17 U/L (ref 0–44)
AST: 24 U/L (ref 15–41)
Albumin: 4.1 g/dL (ref 3.5–5.0)
Alkaline Phosphatase: 94 U/L (ref 38–126)
Anion gap: 7 (ref 5–15)
BUN: 22 mg/dL (ref 8–23)
CO2: 27 mmol/L (ref 22–32)
Calcium: 9.7 mg/dL (ref 8.9–10.3)
Chloride: 105 mmol/L (ref 98–111)
Creatinine, Ser: 0.84 mg/dL (ref 0.44–1.00)
GFR, Estimated: >60 mL/min (ref >60)
Glucose, Bld: 88 mg/dL (ref 70–99)
Potassium: 4.5 mmol/L (ref 3.5–5.1)
Sodium: 139 mmol/L (ref 135–145)
Total Bilirubin: 0.7 mg/dL (ref 0.3–1.2)
Total Protein: 7.4 g/dL (ref 6.5–8.1)

## 2022-11-14 LAB — IRON AND TIBC
Iron: 58 ug/dL (ref 28–170)
Saturation Ratios: 13 % (ref 10.4–31.8)
TIBC: 449 ug/dL (ref 250–450)
UIBC: 391 ug/dL

## 2022-11-14 LAB — FERRITIN: Ferritin: 8 ng/mL — ABNORMAL LOW (ref 11–307)

## 2022-11-15 DIAGNOSIS — D509 Iron deficiency anemia, unspecified: Secondary | ICD-10-CM | POA: Insufficient documentation

## 2022-11-15 NOTE — Assessment & Plan Note (Signed)
Patient has discussed with genetic counselor.  11/29/21 [facility] showed DICER1 VUS.  No other pathologic mutations.

## 2022-11-15 NOTE — Progress Notes (Addendum)
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  Status post bilateral mastectomy with reconstruction.  Final mastectomy pathology was not available to me. Declined endocrine therapy. Labs reviewed and discussed with patient. Physical examination did not show any clinical evidence of recurrence  Family history of breast cancer Patient has discussed with genetic counselor.  11/29/21 [employer] showed DICER1 VUS.  No other pathologic mutations.  Iron deficiency anemia Chronically decreased hemoglobin.  Iron panel added today showed ferritin of 8, consistent with iron deficiency anemia.  Recommend patient to start oral iron supplementation ferrous sulfate 325 mg daily-patient opted to purchase over-the-counter supply. Etiology of iron deficiency is unclear.  Patient has appointment to establish with gastroenterology in January for routine colonoscopy.  We also discussed about the palpable liver edge on her physical examination.  Her liver function is normal.  I recommend patient to further discuss with primary care provider or GI doctor.  Future ultrasound right upper quadrant can be considered.  She agrees with the plan.  Orders Placed This Encounter  Procedures   Iron and TIBC([facility])   Ferritin    Standing Status:   Future    Number of Occurrences:   1    Standing Expiration Date:   11/15/2023   CBC with Differential/Platelet    Standing Status:   Future    Standing Expiration Date:   11/15/2023   Comprehensive metabolic panel    Standing Status:   Future    Standing Expiration Date:   11/15/2023    All questions were answered. The patient knows to call the clinic with any  problems, questions or concerns.  Earlie Server, MD, PhD [facility] Hematology Oncology 11/14/2022   CHIEF COMPLAINTS/REASON FOR VISIT:  history of right breast cancer, iron deficiency anemia  HISTORY OF PRESENTING ILLNESS:   Janice Sanford is a  71 y.o.  female with PMH listed below was seen in consultation at the request of  No ref. provider found  for evaluation of right breast cancer  Patient lives in both [place] and [place]. Previous oncology care was in [place].  Reviewed pathology report of her right breast biopsy in May 2022. 04/24/2021, Site 1, 6:00 to 7:00 right breast, atypical ductal hyperplasia.  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 ER 90% positive, PR 90% positive, HER2 IHC negative  Patient has elected to have bilateral mastectomy.  Final mastectomy pathology report was not available to me. Patient has declined adjuvant endocrine therapy.  Menarche 71 years of age, Age of first childbirth Patient has history of hormone replacement therapy for 10+ years.  Currently off. OCP use, remote Denies any chest wall radiation.  Family history is positive for mother who was diagnosed with breast cancer in her 46s, father was diagnosed with lung cancer. No other cancer family history.  INTERVAL HISTORY Janice Sanford is a 71 y.o. female who has above history reviewed by me today presents for follow up visit for management of history of right breast cancer. Patient complains right breast sharp pain, intermittent, spontaneously resolves.  She has no other breast complaints.  Denies any new complaints. She is now retired and resides  primarily in [place].  She had a telemetry medicine with her surgeon.   03/13/2022 she had bilateral revisions of reconstructed breasts with exchange of TE for saline implants She also had skin biopsy of the  right anterior shoulder and skin of the right upper back.  Pathology showed actinic keratosis, bowenoid, no malignancy in the sections examined.   Review of Systems  Constitutional:  Negative for appetite change, chills, fatigue and fever.  HENT:   Negative for hearing loss and voice change.   Eyes:  Negative for eye problems.  Respiratory:  Negative for chest tightness and cough.   Cardiovascular:  Negative for chest pain.  Gastrointestinal:  Negative for abdominal distention, abdominal pain and blood in stool.  Endocrine: Negative for hot flashes.  Genitourinary:  Negative for difficulty urinating and frequency.   Musculoskeletal:  Negative for arthralgias.  Skin:  Negative for itching and rash.  Neurological:  Negative for extremity weakness.  Hematological:  Negative for adenopathy.  Psychiatric/Behavioral:  Negative for confusion.     MEDICAL HISTORY:  Past Medical History:  Diagnosis Date   Breast cancer ([place])    Cataract    Depression    Family history of breast cancer    Family history of lung cancer    Family history of stomach cancer    GERD (gastroesophageal reflux disease)    S/P bilateral breast lumpectomy 07/2021    SURGICAL HISTORY: Past Surgical History:  Procedure Laterality Date   CESAREAN SECTION     3 c -section   HERNIA REPAIR     2005 and 2013   REPOSITION OF LENS  2019   ROTATOR CUFF REPAIR     2010 & 2012    SOCIAL HISTORY: Social History   Socioeconomic History   Marital status: Married    Spouse name: Not on file   Number of children: Not on file   Years of education: Not on file   Highest education level: Not on file  Occupational History   Not on file  Tobacco Use   Smoking status: Former    Types: Cigarettes    Quit date: 1984    Years since quitting: 39.9   Smokeless tobacco: Former  Substance and Sexual Activity   Alcohol use: Yes    Alcohol/week: 1.0 standard drink of alcohol    Types: 1 Standard drinks or equivalent per  week    Comment: ocassional   Drug use: Not Currently   Sexual activity: Yes  Other Topics Concern   Not on file  Social History Narrative   Not on file   Social Determinants of Health   Financial Resource Strain: Not on file  Food Insecurity: Not on file  Transportation Needs: Not on file  Physical Activity: Not on file  Stress: Not on file  Social Connections: Not on file  Intimate Partner Violence: Not on file    FAMILY HISTORY: Family History  Problem Relation Age of Onset   Breast cancer Mother    Lung cancer Father    Other Sister    Parkinson's disease Brother     ALLERGIES:  is allergic to clindamycin.  MEDICATIONS:  Current Outpatient Medications  Medication Sig Dispense Refill   celecoxib (CELEBREX) 200 MG capsule celecoxib 200 mg capsule  TAKE 1 CAPSULE (200 MG) BY ORAL ROUTE ONCE DAILY     pantoprazole (PROTONIX) 40 MG tablet Take 40 mg by mouth 2 (two) times daily.     venlafaxine XR (EFFEXOR-XR) 150 MG 24  hr capsule venlafaxine ER 150 mg capsule,extended release 24 hr     No current facility-administered medications for this visit.     PHYSICAL EXAMINATION: ECOG PERFORMANCE STATUS: 0 - Asymptomatic Vitals:   11/14/22 1352  BP: (!) 175/91  Pulse: 74  Temp: (!) 96.7 F (35.9 C)  SpO2: 99%   Filed Weights   11/14/22 1352  Weight: 73.5 kg    Physical Exam Constitutional:      General: She is not in acute distress. HENT:     Head: Normocephalic and atraumatic.  Eyes:     General: No scleral icterus. Cardiovascular:     Rate and Rhythm: Normal rate and regular rhythm.     Heart sounds: Normal heart sounds.  Pulmonary:     Effort: Pulmonary effort is normal. No respiratory distress.     Breath sounds: No wheezing.  Abdominal:     General: Bowel sounds are normal. There is no distension.     Palpations: Abdomen is soft.     Comments: I am able to appreciate the edge of her liver just below the costal margin.  Musculoskeletal:         General: No deformity. Normal range of motion.     Cervical back: Normal range of motion and neck supple.  Skin:    General: Skin is warm and dry.     Findings: No erythema or rash.  Neurological:     Mental Status: She is alert and oriented to person, place, and time. Mental status is at baseline.     Cranial Nerves: No cranial nerve deficit.     Coordination: Coordination normal.  Psychiatric:        Mood and Affect: Mood normal.    Breast exam is performed in seated and lying down position. Patient is status post bilateral mastectomy with reconstruction. The implant edges are intact and there is no evidence of any chest wall recurrence. No evidence of bilateral axillary adenopathy    LABORATORY DATA:  I have reviewed the data as listed    Latest Ref Rng & Units 11/14/2022    1:43 PM 05/14/2022    1:29 PM 01/22/2022    3:15 PM  CBC  WBC 4.0 - 10.5 K/uL 4.1  4.5  5.0   Hemoglobin 12.0 - 15.0 g/dL 11.7  11.9  11.6   Hematocrit 36.0 - 46.0 % 37.0  38.1  37.6   Platelets 150 - 400 K/uL 224  208  356       Latest Ref Rng & Units 11/14/2022    1:43 PM 05/14/2022    1:29 PM 01/22/2022    3:15 PM  CMP  Glucose 70 - 99 mg/dL 88  86  120   BUN 8 - 23 mg/dL _0 Creatinine 0.44 - 1.00 mg/dL 0.84  0.97  1.06   Sodium 135 - 145 mmol/L 139  139  137   Potassium 3.5 - 5.1 mmol/L 4.5  4.2  4.6   Chloride 98 - 111 mmol/L 105  106  105   CO2 22 - 32 mmol/L _1 Calcium 8.9 - 10.3 mg/dL 9.7  9.5  9.7   Total Protein 6.5 - 8.1 g/dL 7.4  7.5    Total Bilirubin 0.3 - 1.2 mg/dL 0.7  0.3    Alkaline Phos 38 - 126 U/L 94  102    AST 15 - 41 U/L 24  23  ALT 0 - 44 U/L 17  15       Iron/TIBC/Ferritin/ %Sat    Component Value Date/Time   IRON 58 11/14/2022 1343   TIBC 449 11/14/2022 1343   FERRITIN 8 (L) 11/14/2022 1343   IRONPCTSAT 13 11/14/2022 1343      RADIOGRAPHIC STUDIES: I have personally reviewed the radiological images as listed and agreed with the findings in  the report. No results found.

## 2022-11-15 NOTE — Assessment & Plan Note (Signed)
#  History of right breast invasive carcinoma, ER positive, PR positive, HER2 negative.  Status post bilateral mastectomy with reconstruction.  Final mastectomy pathology was not available to me. Declined endocrine therapy. Labs reviewed and discussed with patient. Physical examination did not show any clinical evidence of recurrence

## 2022-11-15 NOTE — Assessment & Plan Note (Addendum)
Chronically decreased hemoglobin.  Iron panel added today showed ferritin of 8, consistent with iron deficiency anemia.  Recommend patient to start oral iron supplementation ferrous sulfate 325 mg daily-patient opted to purchase over-the-counter supply. Etiology of iron deficiency is unclear.  Patient has appointment to establish with gastroenterology in January for routine colonoscopy.  We also discussed about the palpable liver edge on her physical examination.  Her liver function is normal.  I recommend patient to further discuss with primary care provider or GI doctor.  Future ultrasound right upper quadrant can be considered.  She agrees with the plan.

## 2023-01-06 ENCOUNTER — Emergency Department
Admission: EM | Admit: 2023-01-06 | Discharge: 2023-01-06 | Disposition: A | Discharge status: Home / Self Care | Payer: Medicare Other | Attending: Emergency Medicine | Admitting: Emergency Medicine

## 2023-01-06 ENCOUNTER — Emergency Department: Payer: Medicare Other

## 2023-01-06 ENCOUNTER — Other Ambulatory Visit: Payer: Self-pay

## 2023-01-06 DIAGNOSIS — R112 Nausea with vomiting, unspecified: Secondary | ICD-10-CM | POA: Insufficient documentation

## 2023-01-06 DIAGNOSIS — R1032 Left lower quadrant pain: Secondary | ICD-10-CM | POA: Diagnosis present

## 2023-01-06 DIAGNOSIS — R509 Fever, unspecified: Secondary | ICD-10-CM | POA: Diagnosis not present

## 2023-01-06 DIAGNOSIS — R109 Unspecified abdominal pain: Secondary | ICD-10-CM

## 2023-01-06 DIAGNOSIS — Z1152 Encounter for screening for COVID-19: Secondary | ICD-10-CM | POA: Insufficient documentation

## 2023-01-06 DIAGNOSIS — R197 Diarrhea, unspecified: Secondary | ICD-10-CM | POA: Insufficient documentation

## 2023-01-06 DIAGNOSIS — N12 Tubulo-interstitial nephritis, not specified as acute or chronic: Secondary | ICD-10-CM

## 2023-01-06 DIAGNOSIS — D72829 Elevated white blood cell count, unspecified: Secondary | ICD-10-CM | POA: Diagnosis not present

## 2023-01-06 LAB — COMPREHENSIVE METABOLIC PANEL
ALT: 31 U/L (ref 0–44)
AST: 40 U/L (ref 15–41)
Albumin: 3.9 g/dL (ref 3.5–5.0)
Alkaline Phosphatase: 81 U/L (ref 38–126)
Anion gap: 12 (ref 5–15)
BUN: 18 mg/dL (ref 8–23)
CO2: 20 mmol/L — ABNORMAL LOW (ref 22–32)
Calcium: 9.4 mg/dL (ref 8.9–10.3)
Chloride: 101 mmol/L (ref 98–111)
Creatinine, Ser: 0.94 mg/dL (ref 0.44–1.00)
GFR, Estimated: >60 mL/min (ref >60)
Glucose, Bld: 107 mg/dL — ABNORMAL HIGH (ref 70–99)
Potassium: 3.8 mmol/L (ref 3.5–5.1)
Sodium: 133 mmol/L — ABNORMAL LOW (ref 135–145)
Total Bilirubin: 1.1 mg/dL (ref 0.3–1.2)
Total Protein: 7.7 g/dL (ref 6.5–8.1)

## 2023-01-06 LAB — RESP PANEL BY RT-PCR (RSV, FLU A&B, COVID)  RVPGX2
Influenza A by PCR: NEGATIVE
Influenza B by PCR: NEGATIVE
Resp Syncytial Virus by PCR: NEGATIVE
SARS Coronavirus 2 by RT PCR: NEGATIVE

## 2023-01-06 LAB — URINALYSIS, ROUTINE W REFLEX MICROSCOPIC
Bilirubin Urine: NEGATIVE
Glucose, UA: NEGATIVE mg/dL
Ketones, ur: 20 mg/dL — AB
Nitrite: POSITIVE — AB
Protein, ur: 30 mg/dL — AB
Specific Gravity, Urine: 1.016 (ref 1.005–1.030)
WBC, UA: >50 WBC/hpf — ABNORMAL HIGH (ref 0–5)
pH: 6 (ref 5.0–8.0)

## 2023-01-06 LAB — CBC
HCT: 40.3 % (ref 36.0–46.0)
Hemoglobin: 13.1 g/dL (ref 12.0–15.0)
MCH: 27.2 pg (ref 26.0–34.0)
MCHC: 32.5 g/dL (ref 30.0–36.0)
MCV: 83.8 fL (ref 80.0–100.0)
Platelets: 207 10*3/uL (ref 150–400)
RBC: 4.81 MIL/uL (ref 3.87–5.11)
RDW: 16 % — ABNORMAL HIGH (ref 11.5–15.5)
WBC: 10.9 10*3/uL — ABNORMAL HIGH (ref 4.0–10.5)
nRBC: 0 % (ref 0.0–0.2)

## 2023-01-06 LAB — LIPASE, BLOOD: Lipase: 34 U/L (ref 11–51)

## 2023-01-06 MED ORDER — ONDANSETRON HCL 4 MG/2ML IJ SOLN
4.0000 mg | Freq: Once | INTRAMUSCULAR | Status: AC
  Administered 2023-01-06: 4 mg via INTRAVENOUS
  Filled 2023-01-06: qty 2

## 2023-01-06 MED ORDER — CIPROFLOXACIN HCL 500 MG PO TABS: 500.0000 mg | ORAL_TABLET | Freq: Two times a day (BID) | ORAL | 0 refills | Status: AC

## 2023-01-06 MED ORDER — SODIUM CHLORIDE 0.9 % IV SOLN
1.0000 g | Freq: Once | INTRAVENOUS | Status: AC
  Administered 2023-01-06: 1 g via INTRAVENOUS
  Filled 2023-01-06: qty 10

## 2023-01-06 MED ORDER — ACETAMINOPHEN 500 MG PO TABS
1000.0000 mg | ORAL_TABLET | Freq: Once | ORAL | Status: AC
  Administered 2023-01-06: 1000 mg via ORAL
  Filled 2023-01-06: qty 2

## 2023-01-06 MED ORDER — SODIUM CHLORIDE 0.9 % IV BOLUS
1000.0000 mL | Freq: Once | INTRAVENOUS | Status: AC
  Administered 2023-01-06: 1000 mL via INTRAVENOUS

## 2023-01-06 MED ORDER — MORPHINE SULFATE (PF) 4 MG/ML IV SOLN
4.0000 mg | Freq: Once | INTRAVENOUS | Status: AC
  Administered 2023-01-06: 4 mg via INTRAVENOUS
  Filled 2023-01-06: qty 1

## 2023-01-06 MED ORDER — TRAMADOL HCL 50 MG PO TABS: 50.0000 mg | ORAL_TABLET | Freq: Four times a day (QID) | ORAL | 0 refills | Status: DC | PRN

## 2023-01-06 MED ORDER — IOHEXOL 300 MG/ML  SOLN
100.0000 mL | Freq: Once | INTRAMUSCULAR | Status: AC | PRN
  Administered 2023-01-06: 100 mL via INTRAVENOUS

## 2023-01-06 MED ORDER — ONDANSETRON 4 MG PO TBDP: 4.0000 mg | ORAL_TABLET | Freq: Three times a day (TID) | ORAL | 0 refills | Status: DC | PRN

## 2023-01-06 NOTE — ED Provider Notes (Signed)
Urinalysis is consistent with urine tract infection, patient treated with IV Rocephin.  CT kidney lesions likely related to pyelonephritis.  Patient significantly improved, vital signs are reassuring, she would like to go home and I think this is reasonable, she knows to return if any worsening, will start the patient on twice daily Cipro for outpatient treatment of pyelonephritis   Lavonia Drafts, MD 01/06/23 1625

## 2023-01-06 NOTE — Discharge Instructions (Addendum)
As we discussed please follow-up with your primary care doctor to arrange an MRI of your pancreas to evaluate your pancreatic duct dilation within the next month or so.  Return to the emergency department for any worsening abdominal pain fever or any other symptom personally concerning to yourself.

## 2023-01-06 NOTE — ED Triage Notes (Signed)
Pt to ED via [facility] from home. Pt reports N/V since Friday. Pt also reports abdominal pain and HA. EMS unable to obtain access due to pt unable tot tolerate. Pt very anxious  EMS Vs:  95 HR  100% RA  CBG 158  99.5 oral

## 2023-01-06 NOTE — ED Provider Notes (Signed)
[facility] Provider Note    Event Date/Time   First MD Initiated Contact with Patient 01/06/23 1142     (approximate)  History   Chief Complaint: Abdominal Pain  HPI  Janice Sanford is a 72 y.o. female with a past medical history of gastric reflux, depression, presents to the emergency department for several days of abdominal pain nausea vomiting subjective fever/chills.  According to the patient over the past 2 to 3 days she has been experiencing subjective fevers/chills.  States she has been experiencing nausea with intermittent episodes of vomiting and a few episodes of diarrhea as well.  Patient is describing moderate left lower quadrant abdominal pain.  Does state a history of colitis previously.  Denies any cough or congestion.  No chest pain.  States she is having some bodyaches especially in her right shoulder and hip.  Physical Exam   Triage Vital Signs: ED Triage Vitals  Enc Vitals Group     BP 01/06/23 1054 (!) 166/81     Pulse Rate 01/06/23 1054 96     Resp 01/06/23 1054 (!) 24     Temp 01/06/23 1054 98.3 F (36.8 C)     Temp Source 01/06/23 1054 Oral     SpO2 01/06/23 1054 97 %     Weight 01/06/23 1054 158 lb (71.7 kg)     Height 01/06/23 1054 '5\' 10"'$  (1.778 m)     Head Circumference --      Peak Flow --      Pain Score 01/06/23 1048 10     Pain Loc --      Pain Edu? --      Excl. in [place]? --     Most recent vital signs: Vitals:   01/06/23 1054  BP: (!) 166/81  Pulse: 96  Resp: (!) 24  Temp: 98.3 F (36.8 C)  SpO2: 97%    General: Awake, no distress.  CV:  Good peripheral perfusion.  Regular rate and rhythm  Resp:  Normal effort.  Equal breath sounds bilaterally.  Abd:  No distention.  Soft, moderate left lower quadrant tenderness to palpation.  No rebound or guarding.   ED Results / Procedures / Treatments   RADIOLOGY  I have reviewed and interpreted the CT images.  I denies any obvious obstruction or other pathology seen  on my evaluation. Radiology has read multiple ill-defined hypoenhancing right renal lesions possibly indicating multifocal pyelonephritis versus lymphoma.  Also shows pancreatic ductal dilation down to the ampulla without mass identified.  Enlarged inguinal lymph nodes.   MEDICATIONS ORDERED IN ED: Medications  sodium chloride 0.9 % bolus 1,000 mL (has no administration in time range)  morphine (PF) 4 MG/ML injection 4 mg (has no administration in time range)  ondansetron (ZOFRAN) injection 4 mg (has no administration in time range)     IMPRESSION / MDM / ASSESSMENT AND PLAN / ED COURSE  I reviewed the triage vital signs and the nursing notes.  Patient's presentation is most consistent with acute presentation with potential threat to life or bodily function.  Patient presents emergency department for left lower quadrant abdominal pain nausea vomiting diarrhea as well as subjective fever/chills.  Patient has moderate tenderness to palpation.  Differential would include diverticulitis, colitis, UTI pyelonephritis, gastroenteritis or other viral illness.  Patient's basic labs including CBC chemistry LFTs and lipase are reassuring very slight leukocytosis.  We will obtain CT imaging of the abdomen/pelvis.  Will obtain a COVID/flu/RSV swab.  We will IV  hydrate treat pain nausea while awaiting results.  Patient agreeable to plan of care.  Patient CT scan has resulted showing pancreatic ductal dilation patient's LFTs are normal.  No mass seen on CT.  I discussed this finding with the patient and the need to follow-up with her PCP for an MRI as an outpatient.  Patient agreeable.  There is several lesions within the kidney seen on CT scan concerning for possible pyelonephritis versus other causes such as lymphoma.  I have updated the patient based on this as well.  Urinalysis is pending.  Patient's COVID/flu/RSV is negative.  If the urine sample shows an infection anticipate IV antibiotics and possible  discharge home on oral antibiotics.  Patient states she is feeling much better after medications.  If the urinalysis does not show infection patient may require further workup.  Patient care signed out to oncoming provider.  FINAL CLINICAL IMPRESSION(S) / ED DIAGNOSES   Left lower quadrant abdominal pain Nausea vomiting diarrhea   Note:  This document was prepared using Dragon voice recognition software and may include unintentional dictation errors.   Harvest Dark, MD 01/06/23 [phone]

## 2023-01-14 ENCOUNTER — Other Ambulatory Visit: Payer: Self-pay | Admitting: Gastroenterology

## 2023-01-14 DIAGNOSIS — R935 Abnormal findings on diagnostic imaging of other abdominal regions, including retroperitoneum: Secondary | ICD-10-CM

## 2023-01-24 ENCOUNTER — Ambulatory Visit
Admission: RE | Admit: 2023-01-24 | Discharge: 2023-01-24 | Disposition: A | Discharge status: Home / Self Care | Payer: Medicare Other | Source: Ambulatory Visit | Attending: Gastroenterology | Admitting: Gastroenterology

## 2023-01-24 ENCOUNTER — Other Ambulatory Visit: Payer: Self-pay | Admitting: Gastroenterology

## 2023-01-24 DIAGNOSIS — R935 Abnormal findings on diagnostic imaging of other abdominal regions, including retroperitoneum: Secondary | ICD-10-CM

## 2023-01-24 MED ORDER — GADOBUTROL 1 MMOL/ML IV SOLN
7.0000 mL | Freq: Once | INTRAVENOUS | Status: AC | PRN
  Administered 2023-01-24: 7.5 mL via INTRAVENOUS

## 2023-02-21 ENCOUNTER — Ambulatory Visit: Payer: Medicare Other

## 2023-02-21 DIAGNOSIS — K2100 Gastro-esophageal reflux disease with esophagitis, without bleeding: Secondary | ICD-10-CM | POA: Diagnosis not present

## 2023-02-21 DIAGNOSIS — D128 Benign neoplasm of rectum: Secondary | ICD-10-CM | POA: Diagnosis not present

## 2023-02-21 DIAGNOSIS — K5730 Diverticulosis of large intestine without perforation or abscess without bleeding: Secondary | ICD-10-CM | POA: Diagnosis not present

## 2023-02-21 DIAGNOSIS — K640 First degree hemorrhoids: Secondary | ICD-10-CM | POA: Diagnosis not present

## 2023-02-21 DIAGNOSIS — R1310 Dysphagia, unspecified: Secondary | ICD-10-CM | POA: Diagnosis not present

## 2023-02-21 DIAGNOSIS — K2970 Gastritis, unspecified, without bleeding: Secondary | ICD-10-CM | POA: Diagnosis not present

## 2023-02-21 DIAGNOSIS — K6289 Other specified diseases of anus and rectum: Secondary | ICD-10-CM | POA: Diagnosis present

## 2023-03-19 IMAGING — CR DG CHEST 2V
2 series · 2 of 2 positions shown · non-contrast
Comparison: None.

CLINICAL DATA: Breast cancer.

EXAM:
CHEST - 2 VIEW

[chest pa]
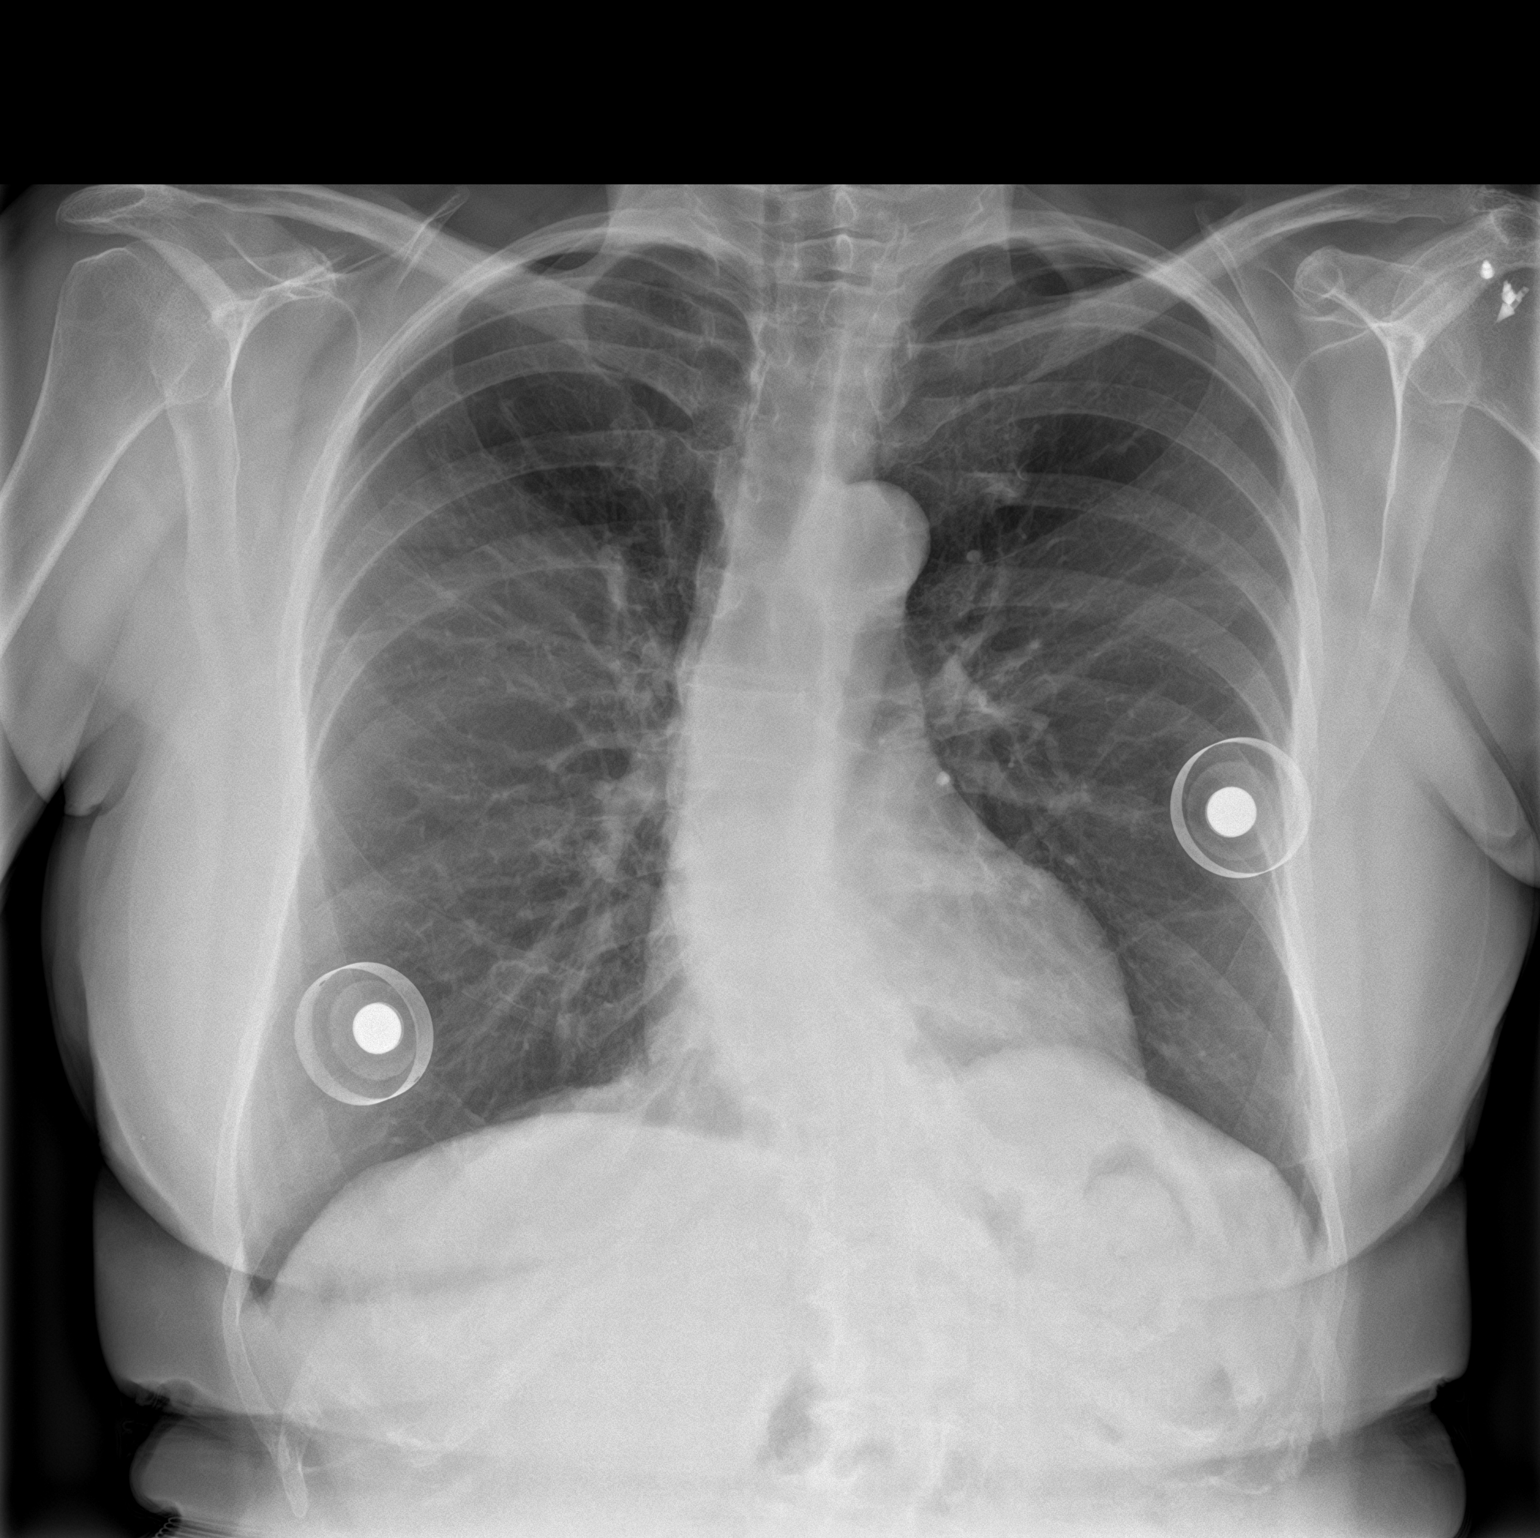

[chest lat]
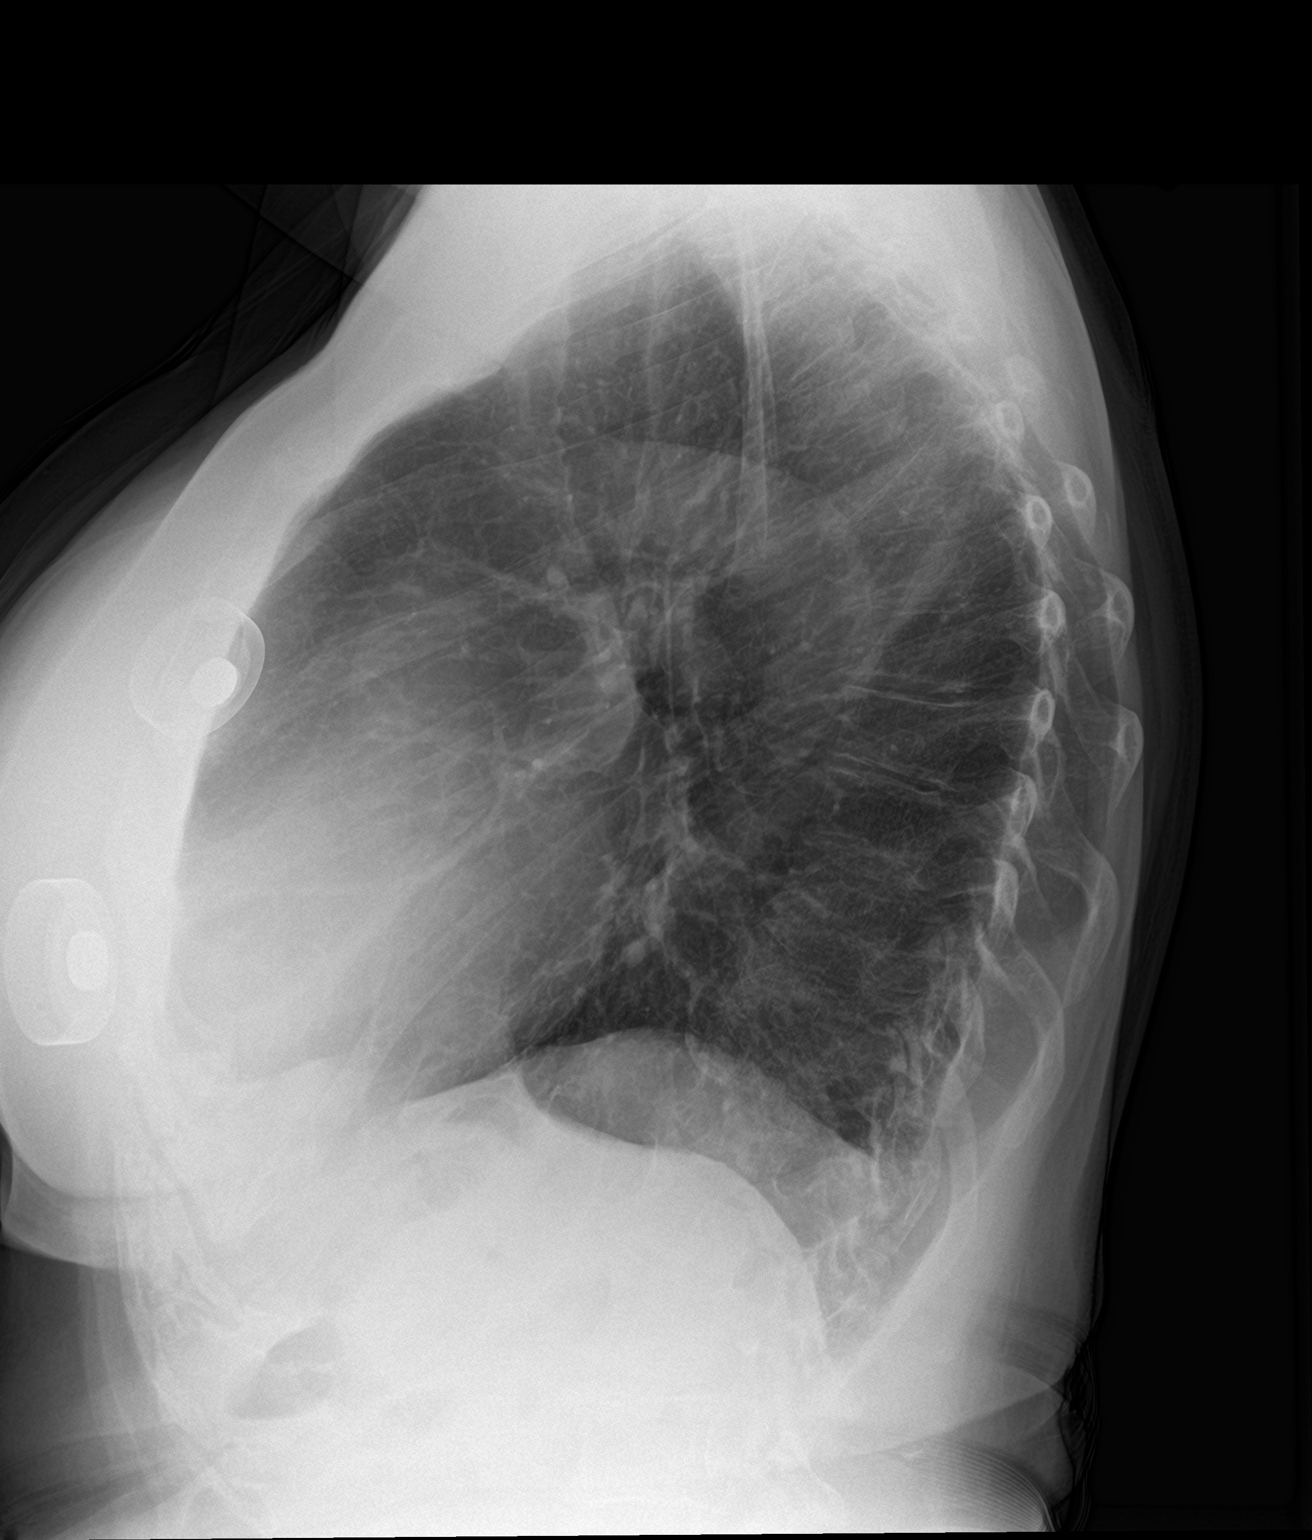

[2 of 2 positions shown; findings below may reference images not displayed]

FINDINGS: Bilateral breast expanders are identified. Scoliotic curvature of
the thoracolumbar spine. The cardiomediastinal silhouette is
unremarkable. No pneumothorax. No nodules or masses. No focal
infiltrates.
IMPRESSION: No acute abnormalities.

## 2023-08-22 ENCOUNTER — Ambulatory Visit (INDEPENDENT_AMBULATORY_CARE_PROVIDER_SITE_OTHER): Payer: Medicare Other | Admitting: Plastic Surgery

## 2023-08-22 ENCOUNTER — Encounter: Payer: Self-pay | Admitting: Plastic Surgery

## 2023-08-22 VITALS — BP 154/82 | HR 74 | Ht 70.0 in | Wt 163.0 lb

## 2023-08-22 DIAGNOSIS — C50112 Malignant neoplasm of central portion of left female breast: Secondary | ICD-10-CM

## 2023-08-22 DIAGNOSIS — Z9013 Acquired absence of bilateral breasts and nipples: Secondary | ICD-10-CM | POA: Insufficient documentation

## 2023-08-22 NOTE — Progress Notes (Signed)
Patient ID: Janice Sanford, female    DOB: 05-11-1951, 72 y.o.   MRN: [id number]   Chief Complaint  Patient presents with   Follow-up   Breast Problem    The patient is a 72 year old female here for a 1 year follow-up after undergoing breast reconstruction surgery.  The surgery was done in [place] in 2022 for a well differentiated infiltrating ductal carcinoma DCIS of the right breast.  She had some intermittent sharp right breast pain which has resolved.  She has a Dance movement psychotherapist smooth round moderate plus profile saline implant 700 cc on the left.  On the right she has a smooth round moderate plus profile saline 650 cc implant in place.  She is content with her reconstruction.  She does mention again that her breasts are much bigger than she had wanted and anticipated.  She does not want a make any changes.  I do not feel any lumps or bumps or areas of concern. The patient weighs 163 pounds.      Review of Systems  Constitutional: Negative.   Eyes: Negative.   Respiratory: Negative.    Cardiovascular: Negative.   Gastrointestinal: Negative.   Endocrine: Negative.   Genitourinary: Negative.   Musculoskeletal: Negative.     Past Medical History:  Diagnosis Date   Breast cancer ([facility])    Cataract    Depression    Family history of breast cancer    Family history of lung cancer    Family history of stomach cancer    GERD (gastroesophageal reflux disease)    S/P bilateral breast lumpectomy 07/2021    Past Surgical History:  Procedure Laterality Date   CESAREAN SECTION     3 c -section   HERNIA REPAIR     2005 and 2013   REPOSITION OF LENS  2019   ROTATOR CUFF REPAIR     2010 & 2012      Current Outpatient Medications:    celecoxib (CELEBREX) 200 MG capsule, celecoxib 200 mg capsule  TAKE 1 CAPSULE (200 MG) BY ORAL ROUTE ONCE DAILY, Disp: , Rfl:    meloxicam (MOBIC) 15 MG tablet, Take 15 mg by mouth daily., Disp: , Rfl:    pantoprazole (PROTONIX) 40 MG tablet, Take 40  mg by mouth 2 (two) times daily., Disp: , Rfl:    venlafaxine XR (EFFEXOR-XR) 150 MG 24 hr capsule, venlafaxine ER 150 mg capsule,extended release 24 hr, Disp: , Rfl:    ondansetron (ZOFRAN-ODT) 4 MG disintegrating tablet, Take 1 tablet (4 mg total) by mouth every 8 (eight) hours as needed for nausea or vomiting., Disp: 20 tablet, Rfl: 0   traMADol (ULTRAM) 50 MG tablet, Take 1 tablet (50 mg total) by mouth every 6 (six) hours as needed., Disp: 20 tablet, Rfl: 0   Objective:   Vitals:   08/22/23 1029  BP: (!) 154/82  Pulse: 74  SpO2: 98%    Physical Exam Vitals and nursing note reviewed.  Constitutional:      Appearance: Normal appearance.  HENT:     Head: Atraumatic.  Cardiovascular:     Rate and Rhythm: Normal rate.     Pulses: Normal pulses.  Pulmonary:     Effort: Pulmonary effort is normal.  Abdominal:     Palpations: Abdomen is soft.  Musculoskeletal:        General: No swelling or deformity.  Skin:    General: Skin is warm.     Capillary Refill: Capillary refill takes less than 2  seconds.     Coloration: Skin is not jaundiced.     Findings: No bruising.  Neurological:     Mental Status: She is alert.  Psychiatric:        Mood and Affect: Mood normal.        Behavior: Behavior normal.        Thought Content: Thought content normal.        Judgment: Judgment normal.     Assessment & Plan:  Malignant neoplasm of central portion of left female breast, unspecified estrogen receptor status (HCC)  Acquired absence of bilateral breasts and nipples  We talked about doing an ultrasound in the next 2 to 3 years.  Unless there is a concern there is no need to do it now.  The patient is pleased with that.  She knows to continue self exams and follow-up in 1 year.  Pictures were obtained of the patient and placed in the chart with the patient's or guardian's permission.   Alena Bills Saksham Akkerman, DO

## 2023-09-25 ENCOUNTER — Other Ambulatory Visit: Payer: Self-pay | Admitting: Sports Medicine

## 2023-09-25 DIAGNOSIS — M7541 Impingement syndrome of right shoulder: Secondary | ICD-10-CM

## 2023-09-25 DIAGNOSIS — G8929 Other chronic pain: Secondary | ICD-10-CM

## 2023-09-25 DIAGNOSIS — M7551 Bursitis of right shoulder: Secondary | ICD-10-CM

## 2023-09-25 DIAGNOSIS — M19011 Primary osteoarthritis, right shoulder: Secondary | ICD-10-CM

## 2023-09-25 DIAGNOSIS — M778 Other enthesopathies, not elsewhere classified: Secondary | ICD-10-CM

## 2023-10-21 ENCOUNTER — Ambulatory Visit
Admission: RE | Admit: 2023-10-21 | Discharge: 2023-10-21 | Disposition: A | Discharge status: Home / Self Care | Payer: Medicare Other | Source: Ambulatory Visit | Attending: Sports Medicine

## 2023-10-21 DIAGNOSIS — M778 Other enthesopathies, not elsewhere classified: Secondary | ICD-10-CM

## 2023-10-21 DIAGNOSIS — M7541 Impingement syndrome of right shoulder: Secondary | ICD-10-CM

## 2023-10-21 DIAGNOSIS — M19011 Primary osteoarthritis, right shoulder: Secondary | ICD-10-CM

## 2023-10-21 DIAGNOSIS — M7551 Bursitis of right shoulder: Secondary | ICD-10-CM

## 2023-10-21 DIAGNOSIS — G8929 Other chronic pain: Secondary | ICD-10-CM

## 2023-11-12 ENCOUNTER — Encounter: Payer: Self-pay | Admitting: Oncology

## 2023-11-12 ENCOUNTER — Inpatient Hospital Stay: Payer: Medicare Other | Attending: Oncology

## 2023-11-12 ENCOUNTER — Inpatient Hospital Stay (HOSPITAL_BASED_OUTPATIENT_CLINIC_OR_DEPARTMENT_OTHER): Payer: Medicare Other | Admitting: Oncology

## 2023-11-12 VITALS — BP 144/84 | HR 67 | Temp 96.0°F | Resp 18 | Wt 167.6 lb

## 2023-11-12 DIAGNOSIS — C50911 Malignant neoplasm of unspecified site of right female breast: Secondary | ICD-10-CM

## 2023-11-12 DIAGNOSIS — Z853 Personal history of malignant neoplasm of breast: Secondary | ICD-10-CM | POA: Insufficient documentation

## 2023-11-12 DIAGNOSIS — Z87891 Personal history of nicotine dependence: Secondary | ICD-10-CM | POA: Insufficient documentation

## 2023-11-12 DIAGNOSIS — Z9013 Acquired absence of bilateral breasts and nipples: Secondary | ICD-10-CM | POA: Insufficient documentation

## 2023-11-12 DIAGNOSIS — D72819 Decreased white blood cell count, unspecified: Secondary | ICD-10-CM | POA: Diagnosis not present

## 2023-11-12 DIAGNOSIS — D709 Neutropenia, unspecified: Secondary | ICD-10-CM | POA: Insufficient documentation

## 2023-11-12 DIAGNOSIS — D509 Iron deficiency anemia, unspecified: Secondary | ICD-10-CM

## 2023-11-12 LAB — COMPREHENSIVE METABOLIC PANEL
ALT: 17 U/L (ref 0–44)
AST: 26 U/L (ref 15–41)
Albumin: 4.2 g/dL (ref 3.5–5.0)
Alkaline Phosphatase: 80 U/L (ref 38–126)
Anion gap: 8 (ref 5–15)
BUN: 16 mg/dL (ref 8–23)
CO2: 24 mmol/L (ref 22–32)
Calcium: 9.5 mg/dL (ref 8.9–10.3)
Chloride: 107 mmol/L (ref 98–111)
Creatinine, Ser: 0.98 mg/dL (ref 0.44–1.00)
GFR, Estimated: >60 mL/min (ref >60)
Glucose, Bld: 100 mg/dL — ABNORMAL HIGH (ref 70–99)
Potassium: 4.3 mmol/L (ref 3.5–5.1)
Sodium: 139 mmol/L (ref 135–145)
Total Bilirubin: 0.7 mg/dL (ref <1.2)
Total Protein: 6.9 g/dL (ref 6.5–8.1)

## 2023-11-12 LAB — CBC WITH DIFFERENTIAL/PLATELET
Abs Immature Granulocytes: 0.01 10*3/uL (ref 0.00–0.07)
Basophils Absolute: 0 10*3/uL (ref 0.0–0.1)
Basophils Relative: 1 %
Eosinophils Absolute: 0.1 10*3/uL (ref 0.0–0.5)
Eosinophils Relative: 3 %
HCT: 40 % (ref 36.0–46.0)
Hemoglobin: 13.1 g/dL (ref 12.0–15.0)
Immature Granulocytes: 0 %
Lymphocytes Relative: 40 %
Lymphs Abs: 1.3 10*3/uL (ref 0.7–4.0)
MCH: 29.6 pg (ref 26.0–34.0)
MCHC: 32.8 g/dL (ref 30.0–36.0)
MCV: 90.5 fL (ref 80.0–100.0)
Monocytes Absolute: 0.3 10*3/uL (ref 0.1–1.0)
Monocytes Relative: 10 %
Neutro Abs: 1.5 10*3/uL — ABNORMAL LOW (ref 1.7–7.7)
Neutrophils Relative %: 46 %
Platelets: 167 10*3/uL (ref 150–400)
RBC: 4.42 MIL/uL (ref 3.87–5.11)
RDW: 13.2 % (ref 11.5–15.5)
WBC: 3.2 10*3/uL — ABNORMAL LOW (ref 4.0–10.5)
nRBC: 0 % (ref 0.0–0.2)

## 2023-11-12 NOTE — Assessment & Plan Note (Addendum)
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  Status post bilateral mastectomy with reconstruction.  Final mastectomy pathology was not available to me. Declined endocrine therapy. Labs reviewed and discussed with patient. Physical examination did not show any clinical evidence of recurrence

## 2023-11-12 NOTE — Assessment & Plan Note (Signed)
Likely due to B12 deficiency. Recommend patient to take B12 supplementation.

## 2023-11-12 NOTE — Progress Notes (Signed)
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  Status post bilateral mastectomy with reconstruction.  Final mastectomy pathology was not available to me. Declined endocrine therapy. Labs reviewed and discussed with patient. Physical examination did not show any clinical evidence of recurrence  Iron deficiency anemia Chronically decreased hemoglobin.   Lab Results  Component Value Date   HGB 13.1 11/12/2023   TIBC 449 11/14/2022   IRONPCTSAT 13 11/14/2022   FERRITIN 8 (L) 11/14/2022   S/p EGD, colonoscopy and capsule study results are not available to me.  Recommend patient to take ferrous sulfate 325 mg every other day.   Leukopenia Likely due to B12 deficiency. Recommend patient to take B12 supplementation.   Orders Placed This Encounter  Procedures   CMP (Cancer Center only)    Standing Status:   Future    Standing Expiration Date:   11/11/2024   CBC with Differential (Cancer Center Only)    Standing Status:   Future    Standing Expiration Date:   11/11/2024   Iron and TIBC    Standing Status:   Future    Standing Expiration Date:   11/11/2024   Ferritin    Standing Status:   Future    Standing Expiration Date:   11/11/2024   Vitamin B12    Standing Status:   Future    Standing Expiration Date:   11/11/2024   Follow up in 6 months.  All questions were answered. The patient knows to call the clinic with any problems, questions or concerns.  Rickard Patience, MD, PhD [facility] Hematology Oncology 11/12/2023   CHIEF COMPLAINTS/REASON FOR VISIT:  history of right breast cancer, iron deficiency anemia  HISTORY OF PRESENTING  ILLNESS:   Janice Sanford is a  72 y.o.  female with PMH listed below was seen in consultation at the request of  Enid Baas, MD  for evaluation of right breast cancer  Patient lives in both [place] and [place]. Previous oncology care was in [place].  Reviewed pathology report of her right breast biopsy in May 2022. 04/24/2021, Site 1, 6:00 to 7:00 right breast, atypical ductal hyperplasia.  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 ER 90% positive, PR 90% positive, HER2 IHC negative  Patient has elected to have bilateral mastectomy.  Final mastectomy pathology report was not available to me. Patient has declined adjuvant endocrine therapy.  Menarche 72 years of age, Age of first childbirth Patient has history of hormone replacement therapy for 10+ years.  Currently off. OCP use, remote Denies any chest wall radiation.  Family history is positive for mother who was diagnosed with breast cancer in her 64s, father was diagnosed with lung cancer. No other cancer family history.  03/13/2022 she had bilateral revisions of reconstructed breasts with exchange of TE for saline implants She also had skin biopsy of the right anterior shoulder and skin of the right upper back.  Pathology showed actinic keratosis, bowenoid, no malignancy in the sections examined.  She is now retired and resides primarily in [place].   INTERVAL HISTORY Janice Sanford is a 72 y.o.  female who has above history reviewed by me today presents for follow up visit for management of history of right breast cancer. She has no new complaints. B12 level was low and she takes B12 supplementation.    Review of Systems  Constitutional:  Negative for appetite change, chills, fatigue and fever.  HENT:   Negative for hearing loss and voice change.   Eyes:  Negative for eye  problems.  Respiratory:  Negative for chest tightness and cough.   Cardiovascular:  Negative for chest pain.  Gastrointestinal:  Negative for abdominal distention, abdominal pain and blood in stool.  Endocrine: Negative for hot flashes.  Genitourinary:  Negative for difficulty urinating and frequency.   Musculoskeletal:  Negative for arthralgias.  Skin:  Negative for itching and rash.  Neurological:  Negative for extremity weakness.  Hematological:  Negative for adenopathy.  Psychiatric/Behavioral:  Negative for confusion.     MEDICAL HISTORY:  Past Medical History:  Diagnosis Date   Breast cancer (HCC)    Cataract    Depression    Family history of breast cancer    Family history of lung cancer    Family history of stomach cancer    GERD (gastroesophageal reflux disease)    S/P bilateral breast lumpectomy 07/2021    SURGICAL HISTORY: Past Surgical History:  Procedure Laterality Date   CESAREAN SECTION     3 c -section   HERNIA REPAIR     2005 and 2013   REPOSITION OF LENS  2019   ROTATOR CUFF REPAIR     2010 & 2012    SOCIAL HISTORY: Social History   Socioeconomic History   Marital status: Married    Spouse name: Not on file   Number of children: Not on file   Years of education: Not on file   Highest education level: Not on file  Occupational History   Not on file  Tobacco Use   Smoking status: Former    Current packs/day: 0.00    Types: Cigarettes    Quit date: 54    Years since quitting: 40.9   Smokeless tobacco: Former  Substance and Sexual Activity   Alcohol use: Yes    Alcohol/week: 1.0 standard drink of alcohol    Types: 1 Standard drinks or equivalent per week    Comment: ocassional   Drug use: Not Currently   Sexual activity: Yes  Other Topics Concern   Not on file  Social History Narrative   Not on file   Social Determinants of Health   Financial Resource Strain: Low Risk  (09/11/2023)   Received from [facility]    Overall Financial Resource Strain (CARDIA)    Difficulty of Paying Living Expenses: Not hard at all  Food Insecurity: No Food Insecurity (09/11/2023)   Received from [facility]   Hunger Vital Sign    Worried About Running Out of Food in the Last Year: Never true    Ran Out of Food in the Last Year: Never true  Transportation Needs: No Transportation Needs (09/11/2023)   Received from [facility] - Transportation    In the past 12 months, has lack of transportation kept you from medical appointments or from getting medications?: No    Lack of Transportation (Non-Medical): No  Physical Activity: Not on file  Stress: Not on file  Social Connections: Not on file  Intimate Partner Violence: Not on file    FAMILY HISTORY: Family History  Problem Relation Age of Onset   Breast cancer Mother    Lung cancer Father    Other Sister    Parkinson's disease Brother     ALLERGIES:  is allergic to clindamycin.  MEDICATIONS:  Current Outpatient Medications  Medication Sig Dispense Refill   celecoxib (CELEBREX) 200 MG capsule celecoxib 200 mg capsule  TAKE 1 CAPSULE (200 MG) BY ORAL ROUTE ONCE DAILY     meloxicam (MOBIC) 15 MG tablet Take 15 mg by mouth daily.     pantoprazole (PROTONIX) 40 MG tablet Take 40 mg by mouth 2 (two) times daily.     venlafaxine XR (EFFEXOR-XR) 75 MG 24 hr capsule Take 75 mg by mouth daily with breakfast.     No current facility-administered medications for this visit.     PHYSICAL EXAMINATION: ECOG PERFORMANCE STATUS: 0 - Asymptomatic Vitals:   11/12/23 1356  BP: (!) 144/84  Pulse: 67  Resp: 18  Temp: (!) 96 F (35.6 C)   Filed Weights   11/12/23 1356  Weight: 167 lb 9.6 oz (76 kg)    Physical Exam Constitutional:      General: She is not in acute distress. HENT:     Head: Normocephalic and atraumatic.  Eyes:     General: No scleral icterus. Cardiovascular:     Rate and Rhythm: Normal rate and  regular rhythm.  Pulmonary:     Effort: Pulmonary effort is normal. No respiratory distress.  Abdominal:     Palpations: Abdomen is soft.  Musculoskeletal:        General: No deformity. Normal range of motion.     Cervical back: Normal range of motion and neck supple.  Skin:    Findings: No erythema or rash.  Neurological:     Mental Status: She is alert and oriented to person, place, and time. Mental status is at baseline.  Psychiatric:        Mood and Affect: Mood normal.   Breast exam is performed in seated and lying down position. Patient is status post bilateral mastectomy with reconstruction. The implant edges are intact and there is no evidence of any chest wall recurrence. No evidence of bilateral axillary adenopathy     LABORATORY DATA:  I have reviewed the data as listed    Latest Ref Rng & Units 11/12/2023    1:45 PM 01/06/2023   10:55 AM 11/14/2022    1:43 PM  CBC  WBC 4.0 - 10.5 K/uL 3.2  10.9  4.1   Hemoglobin 12.0 - 15.0 g/dL 66.4  40.3  47.4   Hematocrit 36.0 - 46.0 % 40.0  40.3  37.0   Platelets 150 - 400 K/uL 167  207  224       Latest Ref Rng & Units 11/12/2023    1:45 PM 01/06/2023   10:55 AM 11/14/2022    1:43 PM  CMP  Glucose 70 - 99 mg/dL 259  563  88   BUN 8 - 23 mg/dL 16  18  22    Creatinine 0.44 - 1.00 mg/dL 8.75  6.43  3.29   Sodium 135 - 145 mmol/L 139  133  139   Potassium 3.5 - 5.1 mmol/L 4.3  3.8  4.5   Chloride 98 - 111 mmol/L 107  101  105   CO2 22 - 32 mmol/L 24  20  27    Calcium 8.9 - 10.3 mg/dL 9.5  9.4  9.7   Total Protein 6.5 - 8.1 g/dL 6.9  7.7  7.4   Total Bilirubin <1.2 mg/dL 0.7  1.1  0.7   Alkaline Phos 38 - 126 U/L 80  81  94   AST 15 - 41 U/L 26  40  24   ALT 0 - 44 U/L 17  31  17       Iron/TIBC/Ferritin/ %Sat    Component Value Date/Time   IRON 58 11/14/2022 1343   TIBC 449 11/14/2022 1343   FERRITIN 8 (L) 11/14/2022 1343   IRONPCTSAT 13 11/14/2022 1343      RADIOGRAPHIC STUDIES: I have personally reviewed the  radiological images as listed and agreed with the findings in the report. MR SHOULDER RIGHT WO CONTRAST  Result Date: 10/31/2023 CLINICAL DATA:  Right shoulder pain EXAM: MRI OF THE RIGHT SHOULDER WITHOUT CONTRAST TECHNIQUE: Multiplanar, multisequence MR imaging of the shoulder was performed. No intravenous contrast was administered. COMPARISON:  None Available. FINDINGS: Rotator cuff: Severe supraspinatus tendinosis with a small full-thickness tear measuring 3 mm AP and 4.9 mm medial-lateral 17 mm from the peripheral insertion and a small partial-thickness bursal surface tear along the anterior insertion. Moderate infraspinatus tendinosis. Teres minor tendon is intact. Mild subscapularis tendinosis. Muscles: No muscle atrophy or edema. No intramuscular fluid collection or hematoma. Biceps Long Head: Moderate tendinosis of the intra-articular portion of the long head of the biceps tendon. Acromioclavicular Joint: Severe arthropathy of the acromioclavicular joint. Trace subacromial/subdeltoid bursal fluid. Glenohumeral Joint: No joint effusion. No chondral defect. Labrum: Grossly intact, but evaluation is limited by lack of intraarticular fluid/contrast. Bones: No fracture or dislocation. No aggressive osseous lesion. Other: No fluid collection or hematoma. IMPRESSION: 1. Severe supraspinatus tendinosis with a small full-thickness tear measuring 3 mm AP and 4.9 mm medial-lateral 17 mm from the peripheral insertion and a small partial-thickness bursal surface tear along the anterior insertion. 2. Moderate infraspinatus tendinosis. 3. Mild subscapularis tendinosis. 4. Moderate tendinosis of the intra-articular portion of the long head of the biceps tendon. Electronically Signed   By: Elige Ko M.D.   On: 10/31/2023 07:51

## 2023-11-12 NOTE — Assessment & Plan Note (Addendum)
Chronically decreased hemoglobin.   Lab Results  Component Value Date   HGB 13.1 11/12/2023   TIBC 449 11/14/2022   IRONPCTSAT 13 11/14/2022   FERRITIN 8 (L) 11/14/2022   S/p EGD, colonoscopy and capsule study results are not available to me.  Recommend patient to take ferrous sulfate 325 mg every other day.

## 2023-11-16 DIAGNOSIS — M75121 Complete rotator cuff tear or rupture of right shoulder, not specified as traumatic: Secondary | ICD-10-CM

## 2023-11-16 HISTORY — DX: Complete rotator cuff tear or rupture of right shoulder, not specified as traumatic: M75.121

## 2023-11-20 ENCOUNTER — Other Ambulatory Visit: Payer: Self-pay | Admitting: Orthopedic Surgery

## 2023-12-02 ENCOUNTER — Inpatient Hospital Stay: Admission: RE | Admit: 2023-12-02 | Payer: Medicare Other | Source: Ambulatory Visit

## 2023-12-03 ENCOUNTER — Other Ambulatory Visit: Payer: Self-pay

## 2023-12-03 ENCOUNTER — Encounter
Admission: RE | Admit: 2023-12-03 | Discharge: 2023-12-03 | Disposition: A | Discharge status: Home / Self Care | Payer: Medicare Other | Source: Ambulatory Visit | Attending: Orthopedic Surgery | Admitting: Orthopedic Surgery

## 2023-12-03 DIAGNOSIS — I499 Cardiac arrhythmia, unspecified: Secondary | ICD-10-CM

## 2023-12-03 HISTORY — DX: Other specified cardiac arrhythmias: I49.8

## 2023-12-03 HISTORY — DX: Cardiac arrhythmia, unspecified: I49.9

## 2023-12-03 HISTORY — DX: Unspecified osteoarthritis, unspecified site: M19.90

## 2023-12-03 HISTORY — DX: Anemia, unspecified: D64.9

## 2023-12-03 NOTE — Patient Instructions (Addendum)
Your procedure is scheduled on: 12/16/23 - Tuesday Report to the Registration Desk on the 1st floor of the [facility] - [address] [place] . [facility] on 12/12/23 for EKG at 11 am ; [address] [place] , [address]- 1st door on right as you enter the building - Pre-operative testing. To find out your arrival time, please call [phone] between 1PM - 3PM on: 12/15/23 - Monday If your arrival time is 6:00 am, do not arrive before that time as the [facility] entrance doors do not open until 6:00 am.  REMEMBER: Instructions that are not followed completely may result in serious medical risk, up to and including death; or upon the discretion of your surgeon and anesthesiologist your surgery may need to be rescheduled.  Do not eat food after midnight the night before surgery.  No gum chewing or hard candies.  You may however, drink CLEAR liquids up to 2 hours before you are scheduled to arrive for your surgery. Do not drink anything within 2 hours of your scheduled arrival time.  Clear liquids include: - water  - apple juice without pulp - gatorade (not RED colors) - black coffee or tea (Do NOT add milk or creamers to the coffee or tea) Do NOT drink anything that is not on this list.  In addition, your doctor has ordered for you to drink the provided:  Ensure Pre-Surgery Clear Carbohydrate Drink  Drinking this carbohydrate drink up to two hours before surgery helps to reduce insulin resistance and improve patient outcomes. Please complete drinking 2 hours before scheduled arrival time.  One week prior to surgery: Stop Anti-inflammatories (NSAIDS) such as meloxicam (MOBIC) , Advil, Aleve, Ibuprofen, Motrin, Naproxen, Naprosyn and Aspirin based products such as Excedrin, Goody's Powder, BC Powder. You may take Tylenol if needed for pain up until the day of surgery.  Stop ANY OVER THE COUNTER supplements until after surgery. Biotin    ON  THE DAY OF SURGERY ONLY TAKE THESE MEDICATIONS WITH SIPS OF WATER:  celecoxib (CELEBREX)  pantoprazole (PROTONIX)    No Alcohol for 24 hours before or after surgery.  No Smoking including e-cigarettes for 24 hours before surgery.  No chewable tobacco products for at least 6 hours before surgery.  No nicotine patches on the day of surgery.  Do not use any "recreational" drugs for at least a week (preferably 2 weeks) before your surgery.  Please be advised that the combination of cocaine and anesthesia may have negative outcomes, up to and including death. If you test positive for cocaine, your surgery will be cancelled.  On the morning of surgery brush your teeth with toothpaste and water, you may rinse your mouth with mouthwash if you wish. Do not swallow any toothpaste or mouthwash.  Use CHG Soap or wipes as directed on instruction sheet.  Do not wear jewelry, make-up, hairpins, clips or nail polish.  For welded (permanent) jewelry: bracelets, anklets, waist bands, etc.  Please have this removed prior to surgery.  If it is not removed, there is a chance that hospital personnel will need to cut it off on the day of surgery.  Do not wear lotions, powders, or perfumes.   Do not shave body hair from the neck down 48 hours before surgery.  Contact lenses, hearing aids and dentures may not be worn into surgery.  Do not bring valuables to the hospital. [facility] is not responsible for any missing/lost belongings or  valuables.   Notify your doctor if there is any change in your medical condition (cold, fever, infection).  Wear comfortable clothing (specific to your surgery type) to the hospital.  After surgery, you can help prevent lung complications by doing breathing exercises.  Take deep breaths and cough every 1-2 hours. Your doctor may order a device called an Incentive Spirometer to help you take deep breaths. When coughing or sneezing, hold a pillow firmly against your  incision with both hands. This is called "splinting." Doing this helps protect your incision. It also decreases belly discomfort.  If you are being admitted to the hospital overnight, leave your suitcase in the car. After surgery it may be brought to your room.  In case of increased patient census, it may be necessary for you, the patient, to continue your postoperative care in the Same Day Surgery department.  If you are being discharged the day of surgery, you will not be allowed to drive home. You will need a responsible individual to drive you home and stay with you for 24 hours after surgery.   If you are taking public transportation, you will need to have a responsible individual with you.  Please call the Pre-admissions Testing Dept. at [phone] if you have any questions about these instructions.  Surgery Visitation Policy:  Patients having surgery or a procedure may have two visitors.  Children under the age of 86 must have an adult with them who is not the patient.  Inpatient Visitation:    Visiting hours are 7 a.m. to 8 p.m. Up to four visitors are allowed at one time in a patient room. The visitors may rotate out with other people during the day.  One visitor age 49 or older may stay with the patient overnight and must be in the room by 8 p.m.    Preparing for Surgery with CHLORHEXIDINE GLUCONATE (CHG) Soap  Chlorhexidine Gluconate (CHG) Soap  o An antiseptic cleaner that kills germs and bonds with the skin to continue killing germs even after washing  o Used for showering the night before surgery and morning of surgery  Before surgery, you can play an important role by reducing the number of germs on your skin.  CHG (Chlorhexidine gluconate) soap is an antiseptic cleanser which kills germs and bonds with the skin to continue killing germs even after washing.  Please do not use if you have an allergy to CHG or antibacterial soaps. If your skin becomes  reddened/irritated stop using the CHG.  1. Shower the NIGHT BEFORE SURGERY and the MORNING OF SURGERY with CHG soap.  2. If you choose to wash your hair, wash your hair first as usual with your normal shampoo.  3. After shampooing, rinse your hair and body thoroughly to remove the shampoo.  4. Use CHG as you would any other liquid soap. You can apply CHG directly to the skin and wash gently with a scrungie or a clean washcloth.  5. Apply the CHG soap to your body only from the neck down. Do not use on open wounds or open sores. Avoid contact with your eyes, ears, mouth, and genitals (private parts). Wash face and genitals (private parts) with your normal soap.  6. Wash thoroughly, paying special attention to the area where your surgery will be performed.  7. Thoroughly rinse your body with warm water.  8. Do not shower/wash with your normal soap after using and rinsing off the CHG soap.  9. Pat yourself dry with  a clean towel.  10. Wear clean pajamas to bed the night before surgery.  12. Place clean sheets on your bed the night of your first shower and do not sleep with pets.  13. Shower again with the CHG soap on the day of surgery prior to arriving at the hospital.  14. Do not apply any deodorants/lotions/powders.  15. Please wear clean clothes to the hospital.

## 2023-12-12 ENCOUNTER — Encounter
Admission: RE | Admit: 2023-12-12 | Discharge: 2023-12-12 | Disposition: A | Discharge status: Home / Self Care | Payer: Medicare Other | Source: Ambulatory Visit | Attending: Orthopedic Surgery | Admitting: Orthopedic Surgery

## 2023-12-12 DIAGNOSIS — I499 Cardiac arrhythmia, unspecified: Secondary | ICD-10-CM | POA: Insufficient documentation

## 2023-12-12 DIAGNOSIS — Z01810 Encounter for preprocedural cardiovascular examination: Secondary | ICD-10-CM | POA: Insufficient documentation

## 2023-12-15 MED ORDER — CHLORHEXIDINE GLUCONATE 0.12 % MT SOLN
15.0000 mL | Freq: Once | OROMUCOSAL | Status: AC
  Administered 2023-12-16: 15 mL via OROMUCOSAL

## 2023-12-15 MED ORDER — ORAL CARE MOUTH RINSE: 15.0000 mL | Freq: Once | OROMUCOSAL | Status: AC

## 2023-12-15 MED ORDER — LACTATED RINGERS IV SOLN
INTRAVENOUS | Status: DC
Start: 2023-12-15 — End: 2023-12-16

## 2023-12-15 MED ORDER — CEFAZOLIN SODIUM-DEXTROSE 2-4 GM/100ML-% IV SOLN
2.0000 g | INTRAVENOUS | Status: AC
  Administered 2023-12-16: 2 g via INTRAVENOUS

## 2023-12-16 ENCOUNTER — Other Ambulatory Visit: Payer: Self-pay

## 2023-12-16 ENCOUNTER — Encounter: Payer: Self-pay | Admitting: Orthopedic Surgery

## 2023-12-16 ENCOUNTER — Ambulatory Visit: Payer: Medicare Other | Admitting: Certified Registered"

## 2023-12-16 ENCOUNTER — Ambulatory Visit: Payer: Medicare Other

## 2023-12-16 ENCOUNTER — Ambulatory Visit: Payer: Medicare Other | Admitting: Urgent Care

## 2023-12-16 ENCOUNTER — Encounter
Admission: RE | Disposition: A | Discharge status: Home / Self Care | Payer: Self-pay | Source: Ambulatory Visit | Attending: Orthopedic Surgery

## 2023-12-16 ENCOUNTER — Ambulatory Visit
Admission: RE | Admit: 2023-12-16 | Discharge: 2023-12-16 | Disposition: A | Discharge status: Home / Self Care | Payer: Medicare Other | Source: Ambulatory Visit | Attending: Orthopedic Surgery | Admitting: Orthopedic Surgery

## 2023-12-16 DIAGNOSIS — M25812 Other specified joint disorders, left shoulder: Secondary | ICD-10-CM | POA: Insufficient documentation

## 2023-12-16 DIAGNOSIS — Z87891 Personal history of nicotine dependence: Secondary | ICD-10-CM | POA: Insufficient documentation

## 2023-12-16 DIAGNOSIS — M19012 Primary osteoarthritis, left shoulder: Secondary | ICD-10-CM | POA: Diagnosis not present

## 2023-12-16 DIAGNOSIS — M75102 Unspecified rotator cuff tear or rupture of left shoulder, not specified as traumatic: Secondary | ICD-10-CM | POA: Diagnosis present

## 2023-12-16 HISTORY — PX: ARTHOSCOPIC ROTAOR CUFF REPAIR: SHX5002

## 2023-12-16 SURGERY — REPAIR, ROTATOR CUFF, ARTHROSCOPIC
Anesthesia: General | Site: Shoulder | Laterality: Right

## 2023-12-16 MED ORDER — CHLORHEXIDINE GLUCONATE 0.12 % MT SOLN
OROMUCOSAL | Status: AC
  Filled 2023-12-16: qty 15

## 2023-12-16 MED ORDER — CEFAZOLIN SODIUM-DEXTROSE 2-4 GM/100ML-% IV SOLN
INTRAVENOUS | Status: AC
  Filled 2023-12-16: qty 100

## 2023-12-16 MED ORDER — LIDOCAINE HCL (CARDIAC) PF 100 MG/5ML IV SOSY
PREFILLED_SYRINGE | INTRAVENOUS | Status: DC | PRN
  Administered 2023-12-16: 80 mg via INTRAVENOUS

## 2023-12-16 MED ORDER — ONDANSETRON HCL 4 MG/2ML IJ SOLN
INTRAMUSCULAR | Status: DC | PRN
  Administered 2023-12-16: 4 mg via INTRAVENOUS

## 2023-12-16 MED ORDER — PROPOFOL 10 MG/ML IV BOLUS
INTRAVENOUS | Status: DC | PRN
  Administered 2023-12-16: 150 mg via INTRAVENOUS
  Administered 2023-12-16: 50 mg via INTRAVENOUS

## 2023-12-16 MED ORDER — BUPIVACAINE HCL (PF) 0.5 % IJ SOLN
INTRAMUSCULAR | Status: DC | PRN
  Administered 2023-12-16: 10 mL via PERINEURAL

## 2023-12-16 MED ORDER — ROCURONIUM BROMIDE 100 MG/10ML IV SOLN
INTRAVENOUS | Status: DC | PRN
  Administered 2023-12-16: 50 mg via INTRAVENOUS
  Administered 2023-12-16: 10 mg via INTRAVENOUS

## 2023-12-16 MED ORDER — EPINEPHRINE PF 1 MG/ML IJ SOLN
INTRAMUSCULAR | Status: AC
  Filled 2023-12-16: qty 4

## 2023-12-16 MED ORDER — EPHEDRINE SULFATE-NACL 50-0.9 MG/10ML-% IV SOSY
PREFILLED_SYRINGE | INTRAVENOUS | Status: DC | PRN
  Administered 2023-12-16: 2 mg via INTRAVENOUS
  Administered 2023-12-16 (×2): 10 mg via INTRAVENOUS

## 2023-12-16 MED ORDER — ONDANSETRON HCL 4 MG/2ML IJ SOLN
INTRAMUSCULAR | Status: AC
  Filled 2023-12-16: qty 2

## 2023-12-16 MED ORDER — FENTANYL CITRATE (PF) 100 MCG/2ML IJ SOLN
INTRAMUSCULAR | Status: AC
  Filled 2023-12-16: qty 2

## 2023-12-16 MED ORDER — PROPOFOL 10 MG/ML IV BOLUS
INTRAVENOUS | Status: AC
  Filled 2023-12-16: qty 20

## 2023-12-16 MED ORDER — MIDAZOLAM HCL 2 MG/2ML IJ SOLN
1.0000 mg | INTRAMUSCULAR | Status: AC | PRN
  Administered 2023-12-16 (×2): 1 mg via INTRAVENOUS

## 2023-12-16 MED ORDER — OXYCODONE HCL 5 MG/5ML PO SOLN: 5.0000 mg | Freq: Once | ORAL | Status: AC | PRN

## 2023-12-16 MED ORDER — PHENYLEPHRINE 80 MCG/ML (10ML) SYRINGE FOR IV PUSH (FOR BLOOD PRESSURE SUPPORT)
PREFILLED_SYRINGE | INTRAVENOUS | Status: DC | PRN
  Administered 2023-12-16: 80 ug via INTRAVENOUS

## 2023-12-16 MED ORDER — DEXAMETHASONE SODIUM PHOSPHATE 10 MG/ML IJ SOLN
INTRAMUSCULAR | Status: DC | PRN
  Administered 2023-12-16: 10 mg via INTRAVENOUS

## 2023-12-16 MED ORDER — MIDAZOLAM HCL 2 MG/2ML IJ SOLN
INTRAMUSCULAR | Status: AC
Start: 2023-12-16 — End: ?
  Filled 2023-12-16: qty 2

## 2023-12-16 MED ORDER — BUPIVACAINE LIPOSOME 1.3 % IJ SUSP
INTRAMUSCULAR | Status: DC | PRN
  Administered 2023-12-16: 20 mL via PERINEURAL

## 2023-12-16 MED ORDER — DROPERIDOL 2.5 MG/ML IJ SOLN
0.6250 mg | Freq: Once | INTRAMUSCULAR | Status: DC | PRN
Start: 2023-12-16 — End: 2023-12-16

## 2023-12-16 MED ORDER — OXYCODONE HCL 5 MG PO TABS: 5.0000 mg | ORAL_TABLET | ORAL | 0 refills | Status: DC | PRN

## 2023-12-16 MED ORDER — VASOPRESSIN 20 UNIT/ML IV SOLN
INTRAVENOUS | Status: DC | PRN
  Administered 2023-12-16 (×4): 1 [IU] via INTRAVENOUS

## 2023-12-16 MED ORDER — BUPIVACAINE LIPOSOME 1.3 % IJ SUSP
INTRAMUSCULAR | Status: AC
  Filled 2023-12-16: qty 20

## 2023-12-16 MED ORDER — BUPIVACAINE HCL (PF) 0.5 % IJ SOLN
INTRAMUSCULAR | Status: AC
  Filled 2023-12-16: qty 10

## 2023-12-16 MED ORDER — ACETAMINOPHEN 10 MG/ML IV SOLN: 1000.0000 mg | Freq: Once | INTRAVENOUS | Status: DC | PRN

## 2023-12-16 MED ORDER — OXYCODONE HCL 5 MG PO TABS
ORAL_TABLET | ORAL | Status: AC
  Filled 2023-12-16: qty 1

## 2023-12-16 MED ORDER — ACETAMINOPHEN 10 MG/ML IV SOLN
INTRAVENOUS | Status: DC | PRN
  Administered 2023-12-16: 1000 mg via INTRAVENOUS

## 2023-12-16 MED ORDER — ONDANSETRON 4 MG PO TBDP: 4.0000 mg | ORAL_TABLET | Freq: Three times a day (TID) | ORAL | 0 refills | Status: DC | PRN

## 2023-12-16 MED ORDER — ACETAMINOPHEN 500 MG PO TABS: 1000.0000 mg | ORAL_TABLET | Freq: Three times a day (TID) | ORAL | 2 refills | Status: DC

## 2023-12-16 MED ORDER — LIDOCAINE HCL (PF) 1 % IJ SOLN
INTRAMUSCULAR | Status: DC | PRN
  Administered 2023-12-16: 3 mL via SUBCUTANEOUS

## 2023-12-16 MED ORDER — SUGAMMADEX SODIUM 200 MG/2ML IV SOLN
INTRAVENOUS | Status: DC | PRN
  Administered 2023-12-16: 200 mg via INTRAVENOUS

## 2023-12-16 MED ORDER — FENTANYL CITRATE (PF) 100 MCG/2ML IJ SOLN
25.0000 ug | INTRAMUSCULAR | Status: DC | PRN
  Administered 2023-12-16 (×4): 25 ug via INTRAVENOUS

## 2023-12-16 MED ORDER — FENTANYL CITRATE (PF) 100 MCG/2ML IJ SOLN
INTRAMUSCULAR | Status: DC | PRN
  Administered 2023-12-16 (×2): 50 ug via INTRAVENOUS

## 2023-12-16 MED ORDER — ACETAMINOPHEN 10 MG/ML IV SOLN
INTRAVENOUS | Status: AC
  Filled 2023-12-16: qty 100

## 2023-12-16 MED ORDER — ASPIRIN 325 MG PO TBEC: 325.0000 mg | DELAYED_RELEASE_TABLET | Freq: Every day | ORAL | 0 refills | Status: AC

## 2023-12-16 MED ORDER — BUPIVACAINE HCL (PF) 0.5 % IJ SOLN
INTRAMUSCULAR | Status: AC
  Filled 2023-12-16: qty 30

## 2023-12-16 MED ORDER — OXYCODONE HCL 5 MG PO TABS
5.0000 mg | ORAL_TABLET | Freq: Once | ORAL | Status: AC | PRN
  Administered 2023-12-16: 5 mg via ORAL

## 2023-12-16 MED ORDER — LACTATED RINGERS IR SOLN: Status: DC | PRN

## 2023-12-16 MED ORDER — LIDOCAINE HCL (PF) 1 % IJ SOLN
INTRAMUSCULAR | Status: AC
  Filled 2023-12-16: qty 5

## 2023-12-16 MED ORDER — LACTATED RINGERS IR SOLN
Status: DC | PRN
  Administered 2023-12-16 (×2): 3000 mL

## 2023-12-16 SURGICAL SUPPLY — 76 items
ADAPTER IRRIG TUBE 2 SPIKE SOL (ADAPTER) ×2 IMPLANT
ANCHOR SUT BIO SW 4.75X19.1 (Anchor) IMPLANT
ANCHOR SWIVELOCK BIO 4.75X19.1 (Anchor) ×1 IMPLANT
BASIN KIT SINGLE STR (MISCELLANEOUS) IMPLANT
BLADE SHAVER 4.5X7 STR FR (MISCELLANEOUS) ×1 IMPLANT
BNDG ADH 2 X3.75 FABRIC TAN LF (GAUZE/BANDAGES/DRESSINGS) ×1 IMPLANT
BUR BR 5.5 WIDE MOUTH (BURR) IMPLANT
CANNULA PART THRD DISP 5.75X7 (CANNULA) IMPLANT
CANNULA PARTIAL THREAD 2X7 (CANNULA) IMPLANT
CANNULA TWIST IN 8.25X7CM (CANNULA) IMPLANT
CANNULA TWIST IN 8.25X9CM (CANNULA) IMPLANT
CHLORAPREP W/TINT 26 (MISCELLANEOUS) ×1 IMPLANT
COOLER POLAR GLACIER W/PUMP (MISCELLANEOUS) ×1 IMPLANT
COVER MAYO STAND STRL (DRAPES) IMPLANT
DERMABOND ADVANCED .7 DNX12 (GAUZE/BANDAGES/DRESSINGS) IMPLANT
DEVICE SUCT BLK HOLE OR FLOOR (MISCELLANEOUS) ×1 IMPLANT
DRAPE INCISE IOBAN 66X45 STRL (DRAPES) ×1 IMPLANT
DRAPE SHEET LG 3/4 BI-LAMINATE (DRAPES) ×1 IMPLANT
DRAPE U-SHAPE 47X51 STRL (DRAPES) ×2 IMPLANT
DRSG TEGADERM 4X4.75 (GAUZE/BANDAGES/DRESSINGS) ×3 IMPLANT
ELECT REM PT RETURN 9FT ADLT (ELECTROSURGICAL) ×1
ELECTRODE REM PT RTRN 9FT ADLT (ELECTROSURGICAL) ×1 IMPLANT
GAUZE SPONGE 4X4 12PLY STRL (GAUZE/BANDAGES/DRESSINGS) ×1 IMPLANT
GAUZE XEROFORM 1X8 LF (GAUZE/BANDAGES/DRESSINGS) ×1 IMPLANT
GLOVE BIO SURGEON STRL SZ7.5 (GLOVE) ×1 IMPLANT
GLOVE BIOGEL PI IND STRL 8 (GLOVE) ×2 IMPLANT
GLOVE SURG ORTHO 8.0 STRL STRW (GLOVE) ×1 IMPLANT
GLOVE SURG SYN 8.0 (GLOVE) ×1 IMPLANT
GLOVE SURG SYN 8.0 PF PI (GLOVE) ×1 IMPLANT
GOWN STRL REUS W/ TWL LRG LVL3 (GOWN DISPOSABLE) ×2 IMPLANT
GOWN STRL REUS W/TWL XL LVL4 (GOWN DISPOSABLE) ×1 IMPLANT
IV LR IRRIG 3000ML ARTHROMATIC (IV SOLUTION) ×4 IMPLANT
KIT CORKSCREW KNTLS 3.9 S/T/P (INSTRUMENTS) IMPLANT
KIT STABILIZATION SHOULDER (MISCELLANEOUS) ×1 IMPLANT
KIT SUTURETAK 3.0 INSERT PERC (KITS) IMPLANT
KIT TURNOVER KIT A (KITS) ×1 IMPLANT
MANIFOLD NEPTUNE II (INSTRUMENTS) ×2 IMPLANT
MASK FACE SPIDER DISP (MASK) ×1 IMPLANT
MAT ABSORB FLUID 56X50 GRAY (MISCELLANEOUS) ×2 IMPLANT
NDL SAFETY ECLIPSE 18X1.5 (NEEDLE) ×1 IMPLANT
NDL SCORPION MULTI FIRE (NEEDLE) IMPLANT
NDL SUT 5 .5 CRC TPR PNT MAYO (NEEDLE) IMPLANT
NEEDLE SCORPION MULTI FIRE (NEEDLE) IMPLANT
PACK ARTHROSCOPY SHOULDER (MISCELLANEOUS) ×1 IMPLANT
PAD ABD DERMACEA PRESS 5X9 (GAUZE/BANDAGES/DRESSINGS) IMPLANT
PAD ARMBOARD 7.5X6 YLW CONV (MISCELLANEOUS) ×1 IMPLANT
PAD WRAPON POLAR SHDR XLG (MISCELLANEOUS) ×1 IMPLANT
PASSER SUT FIRSTPASS SELF (INSTRUMENTS) ×1 IMPLANT
PASSER SUT SWIFTSTITCH HIP CRT (INSTRUMENTS) ×1 IMPLANT
SLEEVE REMOTE CONTROL 5X12 (DRAPES) IMPLANT
SLING ULTRA II M (MISCELLANEOUS) ×1 IMPLANT
SPONGE T-LAP 18X18 ~~LOC~~+RFID (SPONGE) ×1 IMPLANT
STAPLER SKIN PROX 35W (STAPLE) IMPLANT
STRAP SAFETY 5IN WIDE (MISCELLANEOUS) ×1 IMPLANT
SUT ETHILON 3-0 (SUTURE) ×1 IMPLANT
SUT LASSO 90 DEG CVD (SUTURE) IMPLANT
SUT LASSO 90 DEG SD STR (SUTURE) IMPLANT
SUT MNCRL 4-0 27XMFL (SUTURE)
SUT PDS 2-0 27IN (SUTURE) IMPLANT
SUT PROLENE 0 CT 2 (SUTURE) IMPLANT
SUT VIC AB 0 CT1 36 (SUTURE) IMPLANT
SUT VIC AB 2-0 CT2 27 (SUTURE) IMPLANT
SUT XBRAID 1.4 BLUE (SUTURE) IMPLANT
SUTURE MNCRL 4-0 27XMF (SUTURE) IMPLANT
SUTURE TAPE 1.3 40 TPR END (SUTURE) IMPLANT
SUTURETAPE 1.3 40 TPR END (SUTURE) ×1
SYSTEM IMPL TENODESIS LNT 2.9 (Orthopedic Implant) IMPLANT
TAPE CLOTH 3X10 WHT NS LF (GAUZE/BANDAGES/DRESSINGS) ×1 IMPLANT
TAPE MICROFOAM 4IN (TAPE) ×1 IMPLANT
TRAP FLUID SMOKE EVACUATOR (MISCELLANEOUS) ×1 IMPLANT
TUBE SET DOUBLEFLO INFLOW (TUBING) ×1 IMPLANT
TUBE SET DOUBLEFLO OUTFLOW (TUBING) ×1 IMPLANT
TUBING CONNECTING 10 (TUBING) IMPLANT
WAND WEREWOLF FLOW 90D (MISCELLANEOUS) ×1 IMPLANT
WATER STERILE IRR 500ML POUR (IV SOLUTION) ×1 IMPLANT
WRAPON POLAR PAD SHDR XLG (MISCELLANEOUS) ×1

## 2023-12-16 NOTE — Anesthesia Preprocedure Evaluation (Signed)
 Anesthesia Evaluation  Patient identified by MRN, date of birth, ID band Patient awake    Reviewed: Allergy & Precautions, H&P , NPO status , Patient's Chart, lab work & pertinent test results, reviewed documented beta blocker date and time   Airway Mallampati: II  TM Distance: >3 FB Neck ROM: full    Dental  (+) Teeth Intact   Pulmonary neg pulmonary ROS, former smoker   Pulmonary exam normal        Cardiovascular Exercise Tolerance: Good Normal cardiovascular exam+ dysrhythmias  Rhythm:regular Rate:Normal     Neuro/Psych  PSYCHIATRIC DISORDERS  Depression    negative neurological ROS     GI/Hepatic negative GI ROS, Neg liver ROS,,,  Endo/Other  negative endocrine ROS    Renal/GU negative Renal ROS  negative genitourinary   Musculoskeletal   Abdominal   Peds  Hematology  (+) Blood dyscrasia, anemia   Anesthesia Other Findings Past Medical History: No date: Anemia No date: Arthritis No date: Breast cancer (HCC) No date: Cataract 11/2023: Complete tear of right rotator cuff No date: Depression No date: Dysrhythmia No date: Family history of breast cancer No date: Family history of lung cancer No date: Family history of stomach cancer No date: GERD (gastroesophageal reflux disease) No date: Irregular heart beat 07/2021: S/P bilateral breast lumpectomy No date: Ventricular bigeminy Past Surgical History: No date: CESAREAN SECTION     Comment:  3 c -section No date: EYE SURGERY No date: HERNIA REPAIR     Comment:  2005 and 2013 No date: meniscus repair rt knee     Comment:  x 5, left knee x 2 2019: REPOSITION OF LENS No date: ROTATOR CUFF REPAIR     Comment:  2010 & 2012 No date: TONSILLECTOMY     Comment:  as a child BMI    Body Mass Index: 23.68 kg/m     Reproductive/Obstetrics negative OB ROS                             Anesthesia Physical Anesthesia Plan  ASA:  2  Anesthesia Plan: General ETT   Post-op Pain Management: Regional block*   Induction:   PONV Risk Score and Plan: 4 or greater  Airway Management Planned:   Additional Equipment:   Intra-op Plan:   Post-operative Plan:   Informed Consent: I have reviewed the patients History and Physical, chart, labs and discussed the procedure including the risks, benefits and alternatives for the proposed anesthesia with the patient or authorized representative who has indicated his/her understanding and acceptance.     Dental Advisory Given  Plan Discussed with: CRNA  Anesthesia Plan Comments:        Anesthesia Quick Evaluation

## 2023-12-16 NOTE — Discharge Instructions (Signed)

## 2023-12-16 NOTE — Op Note (Addendum)
 SURGERY DATE: 12/16/2023   PRE-OP DIAGNOSIS:  1. Right rotator cuff tear 2. Right subacromial impingement 3. Right biceps tendinopathy 4. Right acromioclavicular joint arthritis   POST-OP DIAGNOSIS: 1. Right rotator cuff tear 2. Right subacromial impingement 3. Right biceps tendinopathy 4. Right acromioclavicular joint arthritis   PROCEDURES:  1. Right arthroscopic rotator cuff repair (supraspinatus musculotendinous junction) 2. Right arthroscopic biceps tenodesis 3. Right arthroscopic extensive debridement of shoulder (glenohumeral and subacromial spaces) 4. Right arthroscopic subacromial decompression 5. Right arthroscopic distal clavicle excision   SURGEON: Earnestine HILARIO Blanch, MD   ASSISTANT: none   ANESTHESIA: Gen with Exparel  interscalene block   ESTIMATED BLOOD LOSS: minimal   DRAINS:  none   TOTAL IV FLUIDS: per anesthesia      SPECIMENS: none   IMPLANTS: - Arthrex 4.83mm SwiveLock x 2 - Arthrex 2.72mm PushLock anchor x 1     OPERATIVE FINDINGS:  Examination under anesthesia: A careful examination under anesthesia was performed.  Passive range of motion was: FF: 160; ER at side: 45; ER in abduction: 90; IR in abduction: 50.  Anterior load shift: NT.  Posterior load shift: NT.  Sulcus in neutral: NT.  Sulcus in ER: NT.     Intra-operative findings: A thorough arthroscopic examination of the shoulder was performed.  The findings are: 1. Biceps tendon: tendinopathy at the biceps anchor 2. Superior labrum: injected with surrounding synovitis 3. Posterior labrum and capsule: normal 4. Inferior capsule and inferior recess: normal 5. Glenoid cartilage surface: Normal 6. Supraspinatus attachment: full-thickness tear at the musculotendinous junction of the anterior supraspinatus 7. Posterior rotator cuff attachment: Normal 8. Humeral head articular cartilage: Focal area of grade 2 degenerative change centrally 9. Rotator interval: significant synovitis 10: Subscapularis  tendon: Normal 11. Anterior labrum: degenerative 12. IGHL: normal   OPERATIVE REPORT:    Indications for procedure:  Janice Sanford is a 72 y.o. female with ~1 year of worsening shoulder pain. Clinical exam and MRI were suggestive of a full-thickness rotator cuff tear; subacromial impingement; AC joint arthritis; and biceps tendinopathy.  She attempted extensive nonoperative management including formal physical therapy, medical management, activity modifications and injections without significant improvement in her symptoms.  Given these findings, we decided to proceed with surgical management. After discussion of risks, benefits, and alternatives to surgery, the patient elected to proceed.    Procedure in detail:   I identified Janice Sanford  in the pre-operative holding area.  I marked the operative shoulder with my initials. I reviewed the risks and benefits of the proposed surgical intervention, and the patient (and/or patient's guardian) wished to proceed.  Anesthesia was then performed with an Exparel  interscalene block.  The patient was transferred to the operative suite and placed in the beach chair position.     Appropriate IV antibiotics were administered prior to incision. The operative upper extremity was then prepped and draped in standard fashion. A time out was performed confirming the correct extremity, correct patient, and correct procedure.    I then created a standard posterior portal with an 11 blade. The glenohumeral joint was easily entered with a blunt trocar and the arthroscope introduced. The findings of diagnostic arthroscopy are described above.  A standard anterior portal was made.  I debrided degenerative tissue including the synovitic tissue about the rotator interval as well as the superior labrum and posterior labrum.  Humeral head cartilaginous surface was also debrided and the focal region of grade 2 degenerative change.  This was performed with an oscillating shaver.  I then coagulated the inflamed synovium to obtain hemostasis and reduce the risk of post-operative swelling using an Arthrocare radiofrequency device.  I then turned my attention to the arthroscopic biceps tenodesis. The Loop n Tack technique was used to pass a FiberTape through the biceps in a locked fashion adjacent to the biceps anchor.  A hole for a 2.9 mm Arthrex PushLock was drilled in the bicipital groove just superior to the subscapularis tendon insertion.  The biceps tendon was then cut and the biceps anchor complex was debrided down to a stable base on the superior labrum.  The FiberTape was loaded onto the PushLock anchor and impacted into place into the previously drilled hole in the bicipital groove.  This appropriately secured the biceps into the bicipital groove and took it off of tension.   Next, the arthroscope was then introduced into the subacromial space.  An extensive subacromial bursectomy and debridement was performed using a combination of the shaver and Arthrocare wand. The entire acromial undersurface was exposed and the CA ligament was subperiosteally elevated to expose the anterior acromial hook. A 5.66mm barrel burr was used to create a flat anterior and lateral aspect of the acromion, converting it from a Type 2 to a Type 1 acromion. Care was made to keep the deltoid fascia intact.   I then turned my attention to the arthroscopic distal clavicle excision. I identified the acromioclavicular joint. Surrounding bursal tissue was debrided and the edges of the joint were identified. I used the 5.76mm barrel burr to remove the distal clavicle parallel to the edge of the acromion. I was able to fit two widths of the burr into the space between the distal clavicle and acromion, signifying that I had removed ~82mm of distal clavicle. This was confirmed by viewing anteriorly and introducing a probe with measuring marks from the lateral portal. Hemostasis was achieved with an Arthrocare wand.    Next, I created an accessory posterolateral portal to assist with visualization and instrumentation.  I debrided the poor quality edges of the supraspinatus tendon.  This was a tear at the musculotendinous junction at the level of the articular margin.  This involved the anterior supraspinatus.  There was good-quality remnant tendon covering the footprint without significant retraction of the medial stump.  The medial stump could be easily mobilized to the footprint. I prepared the footprint using an ArthroCare wand to clear it of soft tissue.     I then percutaneously placed 1 Arthrex SwiveLock C anchor double loaded with suture tape as a medial row anchor at the central portion of the tear at the articular margin. I then shuttled all the strands of tape through the rotator cuff just lateral to the musculotendinous junction using a FirstPass suture passer spanning the anterior to posterior extent of the tear.  The FiberWire from the anchor was then passed in a side-to-side fashion.  First pass was used to pass through the medial stump and a a BirdBeak was used to pass the other strand through the medial stump.  The suture was tied arthroscopically in a side-to-side fashion.next, each strand of suture tape was passed through an It Trainer.  This was placed approximately 2 cm distal to the lateral edge of the footprint at the central portion of the tear with appropriate tensioning of each suture prior to final fixation.  Repair suture from this lateral row anchor was then used to further reduce a small gap posteriorly. This construct allowed for excellent reapproximation of the  medial stump to the remnant lateral tendon at the level of the articular margin.  Upon probing, there was no longer a residual gap into the joint.  The repair was stable to external and internal rotation.   Fluid was evacuated from the shoulder, and the portals were closed with 3-0 Nylon. Xeroform was applied to the  portals. A sterile dressing was applied, followed by a Polar Care sleeve and a SlingShot shoulder immobilizer/sling. The patient was awakened from anesthesia without difficulty and was transferred to the PACU in stable condition.    DISPOSITION: plan for discharge home after recovery in PACU   POSTOPERATIVE PLAN: Remain in sling (except hygiene and elbow/wrist/hand RoM exercises as instructed by PT) x 6 weeks and NWB for this time. PT to begin 3-4 days after surgery.  Use large rotator cuff repair rehab protocol.  ASA 325mg  daily x 2 weeks for DVT ppx.

## 2023-12-16 NOTE — H&P (Signed)
Paper H&P to be scanned into permanent record. H&P reviewed (office note dated 12/01/23). No significant changes noted.

## 2023-12-16 NOTE — Anesthesia Procedure Notes (Signed)
 Anesthesia Regional Block: Interscalene brachial plexus block   Pre-Anesthetic Checklist: , timeout performed,  Correct Patient, Correct Site, Correct Laterality,  Correct Procedure, Correct Position, site marked,  Risks and benefits discussed,  Surgical consent,  Pre-op evaluation,  At surgeon's request and post-op pain management  Laterality: Upper and Right  Prep: chloraprep       Needles:  Injection technique: Single-shot  Needle Type: Echogenic Stimulator Needle     Needle Length: 10cm  Needle Gauge: 20     Additional Needles:   Procedures:,,,, ultrasound used (permanent image in chart),,    Narrative:  Start time: 12/16/2023 12:35 PM End time: 12/16/2023 12:43 PM  Performed by: Personally  Anesthesiologist: Myra Lynwood MATSU, MD  Additional Notes: Pt. Identified and accepting of procedure after risks and benefits fully reviewed and questions answered. Time out performed and laterality confirmed prior to procedure.  ISNB  performed without difficulty and well tolerated.  Neg IV and SATD.  No pain on injection of Local anesthetic and VSST.

## 2023-12-16 NOTE — Transfer of Care (Signed)
 Immediate Anesthesia Transfer of Care Note  Patient: Janice Sanford  Procedure(s) Performed: Right shoulder arthroscopic rotator cuff repair, subacromial decompression, distal clavicle excision, and biceps tenodesis (Right: Shoulder)  Patient Location: PACU  Anesthesia Type:General  Level of Consciousness: awake, alert , and oriented  Airway & Oxygen Therapy: Patient Spontanous Breathing  Post-op Assessment: Report given to RN and Post -op Vital signs reviewed and stable  Post vital signs: Reviewed and stable  Last Vitals:  Vitals Value Taken Time  BP 149/64 12/16/23 1442  Temp    Pulse 82 12/16/23 1446  Resp 17 12/16/23 1446  SpO2 99 % 12/16/23 1446  Vitals shown include unfiled device data.  Last Pain:  Vitals:   12/16/23 1056  TempSrc: Temporal  PainSc: 0-No pain         Complications: No notable events documented.

## 2023-12-16 NOTE — Anesthesia Procedure Notes (Signed)
 Procedure Name: Intubation Date/Time: 12/16/2023 12:58 PM  Performed by: Germaine Maeola CROME, CRNAPre-anesthesia Checklist: Patient identified, Emergency Drugs available, Suction available and Patient being monitored Patient Re-evaluated:Patient Re-evaluated prior to induction Oxygen Delivery Method: Circle system utilized Preoxygenation: Pre-oxygenation with 100% oxygen Induction Type: IV induction Ventilation: Mask ventilation without difficulty Laryngoscope Size: McGrath and 3 Grade View: Grade II Tube type: Oral Tube size: 7.0 mm Number of attempts: 1 Airway Equipment and Method: Stylet Placement Confirmation: ETT inserted through vocal cords under direct vision, positive ETCO2 and breath sounds checked- equal and bilateral Secured at: 21 cm Tube secured with: Tape Dental Injury: Teeth and Oropharynx as per pre-operative assessment

## 2023-12-18 ENCOUNTER — Encounter: Payer: Self-pay | Admitting: Orthopedic Surgery

## 2023-12-23 NOTE — Anesthesia Postprocedure Evaluation (Signed)
 Anesthesia Post Note  Patient: Janice Sanford  Procedure(s) Performed: Right shoulder arthroscopic rotator cuff repair, subacromial decompression, distal clavicle excision, and biceps tenodesis (Right: Shoulder)  Patient location during evaluation: PACU Anesthesia Type: General Level of consciousness: awake and alert Pain management: pain level controlled Vital Signs Assessment: post-procedure vital signs reviewed and stable Respiratory status: spontaneous breathing, nonlabored ventilation, respiratory function stable and patient connected to nasal cannula oxygen Cardiovascular status: blood pressure returned to baseline and stable Postop Assessment: no apparent nausea or vomiting Anesthetic complications: no   No notable events documented.   Last Vitals:  Vitals:   12/16/23 1543 12/16/23 1616  BP: (!) 140/66 123/64  Pulse: 75 70  Resp: 15 15  Temp: (!) 36.4 C (!) 36.4 C  SpO2: 94% 95%    Last Pain:  Vitals:   12/16/23 1616  TempSrc: Temporal  PainSc: 0-No pain                 Lynwood KANDICE Clause

## 2024-08-20 ENCOUNTER — Ambulatory Visit: Payer: Medicare Other | Admitting: Plastic Surgery

## 2024-09-13 ENCOUNTER — Encounter

## 2024-09-27 ENCOUNTER — Encounter

## 2024-11-16 ENCOUNTER — Other Ambulatory Visit: Payer: Self-pay

## 2024-11-16 DIAGNOSIS — C50911 Malignant neoplasm of unspecified site of right female breast: Secondary | ICD-10-CM

## 2024-11-17 ENCOUNTER — Encounter: Payer: Self-pay | Admitting: Oncology

## 2024-11-17 ENCOUNTER — Inpatient Hospital Stay: Payer: Medicare Other | Attending: Oncology

## 2024-11-17 ENCOUNTER — Inpatient Hospital Stay: Payer: Medicare Other | Admitting: Oncology

## 2024-11-17 ENCOUNTER — Inpatient Hospital Stay

## 2024-11-17 VITALS — BP 173/82 | HR 67 | Temp 96.4°F | Resp 18 | Wt 166.6 lb

## 2024-11-17 DIAGNOSIS — Z114 Encounter for screening for human immunodeficiency virus [HIV]: Secondary | ICD-10-CM

## 2024-11-17 DIAGNOSIS — C50911 Malignant neoplasm of unspecified site of right female breast: Secondary | ICD-10-CM | POA: Diagnosis not present

## 2024-11-17 DIAGNOSIS — D72819 Decreased white blood cell count, unspecified: Secondary | ICD-10-CM

## 2024-11-17 DIAGNOSIS — C50511 Malignant neoplasm of lower-outer quadrant of right female breast: Secondary | ICD-10-CM | POA: Diagnosis present

## 2024-11-17 DIAGNOSIS — D509 Iron deficiency anemia, unspecified: Secondary | ICD-10-CM | POA: Insufficient documentation

## 2024-11-17 DIAGNOSIS — Z1159 Encounter for screening for other viral diseases: Secondary | ICD-10-CM | POA: Insufficient documentation

## 2024-11-17 DIAGNOSIS — R5383 Other fatigue: Secondary | ICD-10-CM

## 2024-11-17 DIAGNOSIS — Z853 Personal history of malignant neoplasm of breast: Secondary | ICD-10-CM | POA: Insufficient documentation

## 2024-11-17 DIAGNOSIS — Z7289 Other problems related to lifestyle: Secondary | ICD-10-CM

## 2024-11-17 DIAGNOSIS — Z9013 Acquired absence of bilateral breasts and nipples: Secondary | ICD-10-CM | POA: Diagnosis not present

## 2024-11-17 LAB — HEPATITIS PANEL, ACUTE
HCV Ab: NONREACTIVE
Hep A IgM: NONREACTIVE
Hep B C IgM: NONREACTIVE
Hepatitis B Surface Ag: NONREACTIVE

## 2024-11-17 LAB — CBC WITH DIFFERENTIAL (CANCER CENTER ONLY)
Abs Immature Granulocytes: 0.01 K/uL (ref 0.00–0.07)
Basophils Absolute: 0.1 K/uL (ref 0.0–0.1)
Basophils Relative: 2 %
Eosinophils Absolute: 0.2 K/uL (ref 0.0–0.5)
Eosinophils Relative: 5 %
HCT: 40.5 % (ref 36.0–46.0)
Hemoglobin: 13.6 g/dL (ref 12.0–15.0)
Immature Granulocytes: 0 %
Lymphocytes Relative: 42 %
Lymphs Abs: 1.4 K/uL (ref 0.7–4.0)
MCH: 30.2 pg (ref 26.0–34.0)
MCHC: 33.6 g/dL (ref 30.0–36.0)
MCV: 89.8 fL (ref 80.0–100.0)
Monocytes Absolute: 0.4 K/uL (ref 0.1–1.0)
Monocytes Relative: 11 %
Neutro Abs: 1.3 K/uL — ABNORMAL LOW (ref 1.7–7.7)
Neutrophils Relative %: 40 %
Platelet Count: 187 K/uL (ref 150–400)
RBC: 4.51 MIL/uL (ref 3.87–5.11)
RDW: 12.7 % (ref 11.5–15.5)
WBC Count: 3.3 K/uL — ABNORMAL LOW (ref 4.0–10.5)
nRBC: 0 % (ref 0.0–0.2)

## 2024-11-17 LAB — IRON AND TIBC
Iron: 98 ug/dL (ref 28–170)
Saturation Ratios: 29 % (ref 10.4–31.8)
TIBC: 337 ug/dL (ref 250–450)
UIBC: 240 ug/dL

## 2024-11-17 LAB — CMP (CANCER CENTER ONLY)
ALT: 17 U/L (ref 0–44)
AST: 26 U/L (ref 15–41)
Albumin: 4.4 g/dL (ref 3.5–5.0)
Alkaline Phosphatase: 98 U/L (ref 38–126)
Anion gap: 8 (ref 5–15)
BUN: 22 mg/dL (ref 8–23)
CO2: 26 mmol/L (ref 22–32)
Calcium: 10.2 mg/dL (ref 8.9–10.3)
Chloride: 105 mmol/L (ref 98–111)
Creatinine: 0.95 mg/dL (ref 0.44–1.00)
GFR, Estimated: >60 mL/min (ref >60)
Glucose, Bld: 94 mg/dL (ref 70–99)
Potassium: 4.6 mmol/L (ref 3.5–5.1)
Sodium: 140 mmol/L (ref 135–145)
Total Bilirubin: 0.3 mg/dL (ref 0.0–1.2)
Total Protein: 7.4 g/dL (ref 6.5–8.1)

## 2024-11-17 LAB — TECHNOLOGIST SMEAR REVIEW: Plt Morphology: ADEQUATE

## 2024-11-17 LAB — FERRITIN: Ferritin: 88 ng/mL (ref 11–307)

## 2024-11-17 LAB — LACTATE DEHYDROGENASE: LDH: 232 U/L (ref 105–235)

## 2024-11-17 LAB — HIV ANTIBODY (ROUTINE TESTING W REFLEX): HIV Screen 4th Generation wRfx: NONREACTIVE

## 2024-11-17 LAB — VITAMIN B12: Vitamin B-12: 300 pg/mL (ref 180–914)

## 2024-11-17 NOTE — Assessment & Plan Note (Addendum)
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  Status post bilateral mastectomy with reconstruction.  Final mastectomy pathology was not available to me. Declined endocrine therapy. Continue surveillance.

## 2024-11-17 NOTE — Assessment & Plan Note (Addendum)
 Lab Results  Component Value Date   HGB 13.6 11/17/2024   TIBC 337 11/17/2024   IRONPCTSAT 29 11/17/2024   FERRITIN 88 11/17/2024   S/p EGD, colonoscopy and capsule study results are not available to me.  Normalized hemoglobin and iron panel. Stop oral iron supplementation.

## 2024-11-17 NOTE — Assessment & Plan Note (Addendum)
 B12 level is pending.  Persistent mild neutropenia.  Check flowcytometry, myleoma work up, hepatitis, HIV.

## 2024-11-17 NOTE — Progress Notes (Signed)
 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  Status post bilateral mastectomy with reconstruction.  Final mastectomy pathology was not available to me. Declined endocrine therapy. Continue surveillance.   Iron deficiency anemia Lab Results  Component Value Date   HGB 13.6 11/17/2024   TIBC 337 11/17/2024   IRONPCTSAT 29 11/17/2024   FERRITIN 88 11/17/2024   S/p EGD, colonoscopy and capsule study results are not available to me.  Normalized hemoglobin and iron panel. Stop oral iron supplementation.   Leukopenia B12 level is pending.  Persistent mild neutropenia.  Check flowcytometry, myleoma work up, hepatitis, HIV.  Orders Placed This Encounter  Procedures   HIV Antibody (routine testing w rflx)    Standing Status:   Future    Number of Occurrences:   1    Expected Date:   11/17/2024    Expiration Date:   02/15/2025   Hepatitis panel, acute    Standing Status:   Future    Number of Occurrences:   1    Expected Date:   11/17/2024    Expiration Date:   02/15/2025   Multiple Myeloma Panel (SPEP&IFE w/QIG)    Standing Status:   Future    Number of Occurrences:   1    Expected Date:   11/17/2024    Expiration Date:   02/15/2025   Kappa/lambda light chains    Standing Status:   Future    Number of Occurrences:   1    Expected Date:   11/17/2024    Expiration Date:   02/15/2025   Flow cytometry panel-leukemia/lymphoma work-up    Standing Status:   Future    Number of Occurrences:   1    Expected Date:   11/17/2024    Expiration Date:   02/15/2025   Lactate dehydrogenase    Standing Status:   Future    Number of Occurrences:   1     Expected Date:   11/17/2024    Expiration Date:   02/15/2025   Technologist smear review    Standing Status:   Future    Number of Occurrences:   1    Expected Date:   11/17/2024    Expiration Date:   02/15/2025    Clinical information::   neutropenia   Follow up in 6 months.  All questions were answered. The patient knows to call the clinic with any problems, questions or concerns.  Call Babara, MD, PhD [facility] Hematology Oncology 11/17/2024   CHIEF COMPLAINTS/REASON FOR VISIT:  history of right breast cancer, iron deficiency anemia  HISTORY OF PRESENTING ILLNESS:   Janice Sanford is a  73 y.o.  female with PMH listed below was seen in consultation at the request of  Sherial Bail, MD  for evaluation of right breast cancer  Patient lives in both [place]  and [place] . Previous oncology care was in [place] .  Reviewed pathology report of her right breast biopsy in May 2022. 04/24/2021, Site 1, 6:00 to 7:00 right breast, atypical ductal hyperplasia.  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 ER 90% positive, PR 90% positive,  HER2 IHC negative  Patient has elected to have bilateral mastectomy.  Final mastectomy pathology report was not available to me. Patient has declined adjuvant endocrine therapy.  Menarche 73 years of age, Age of first childbirth Patient has history of hormone replacement therapy for 10+ years.  Currently off. OCP use, remote Denies any chest wall radiation.  Family history is positive for mother who was diagnosed with breast cancer in her 39s, father was diagnosed with lung cancer. No other cancer family history.  03/13/2022 she had bilateral revisions of reconstructed breasts with exchange of TE for saline implants She also had skin biopsy of the right anterior shoulder and skin of the right upper back.  Pathology showed  actinic keratosis, bowenoid, no malignancy in the sections examined.  She is now retired and resides primarily in [place] .   INTERVAL HISTORY Janice Sanford is a 73 y.o. female who has above history reviewed by me today presents for follow up visit for management of history of right breast cancer.  Sleep disturbance. + hot flash recently.    Review of Systems  Constitutional:  Negative for appetite change, chills, fatigue and fever.  HENT:   Negative for hearing loss and voice change.   Eyes:  Negative for eye problems.  Respiratory:  Negative for chest tightness and cough.   Cardiovascular:  Negative for chest pain.  Gastrointestinal:  Negative for abdominal distention, abdominal pain and blood in stool.  Endocrine: Positive for hot flashes.  Genitourinary:  Negative for difficulty urinating and frequency.   Musculoskeletal:  Negative for arthralgias.  Skin:  Negative for itching and rash.  Neurological:  Negative for extremity weakness.  Hematological:  Negative for adenopathy.  Psychiatric/Behavioral:  Positive for sleep disturbance. Negative for confusion.     MEDICAL HISTORY:  Past Medical History:  Diagnosis Date   Anemia    Arthritis    Breast cancer (HCC)    Cataract    Complete tear of right rotator cuff 11/2023   Depression    Dysrhythmia    Family history of breast cancer    Family history of lung cancer    Family history of stomach cancer    GERD (gastroesophageal reflux disease)    Irregular heart beat    S/P bilateral breast lumpectomy 07/2021   Ventricular bigeminy     SURGICAL HISTORY: Past Surgical History:  Procedure Laterality Date   ARTHOSCOPIC ROTAOR CUFF REPAIR Right 12/16/2023   Procedure: Right shoulder arthroscopic rotator cuff repair, subacromial decompression, distal clavicle excision, and biceps tenodesis;  Surgeon: Tobie Priest, MD;  Location: [facility] ORS;  Service: Orthopedics;  Laterality: Right;   CESAREAN SECTION     3 c -section    EYE SURGERY     HERNIA REPAIR     2005 and 2013   meniscus repair rt knee     x 5, left knee x 2   REPOSITION OF LENS  2019   ROTATOR CUFF REPAIR     2010 & 2012   TONSILLECTOMY     as a child    SOCIAL HISTORY: Social History   Socioeconomic History   Marital status: Married    Spouse name: Ship Broker (Spouse)   Number of children: Not on file   Years of education: Not on file   Highest education level: Not on file  Occupational History   Not on file  Tobacco Use   Smoking status: Former    Current packs/day: 0.00    Types: Cigarettes  Quit date: 65    Years since quitting: 41.9   Smokeless tobacco: Former  Substance and Sexual Activity   Alcohol use: Yes    Alcohol/week: 1.0 standard drink of alcohol    Types: 1 Standard drinks or equivalent per week    Comment: ocassional   Drug use: Not Currently   Sexual activity: Yes  Other Topics Concern   Not on file  Social History Narrative   Not on file   Social Drivers of Health   Financial Resource Strain: Low Risk  (09/13/2024)   Received from [facility]   Overall Financial Resource Strain (CARDIA)    Difficulty of Paying Living Expenses: Not hard at all  Food Insecurity: No Food Insecurity (09/20/2024)   Received from [facility]   Hunger Vital Sign    Within the past 12 months, you worried that your food would run out before you got the money to buy more.: Never true    Within the past 12 months, the food you bought just didn't last and you didn't have money to get more.: Never true  Transportation Needs: No Transportation Needs (09/20/2024)   Received from [facility] - Transportation    In the past 12 months, has lack of transportation kept you from medical appointments or from getting medications?: No    Lack of Transportation (Non-Medical): No  Physical Activity: Not on file  Stress: Not on file  Social Connections: Unknown  (02/08/2022)   Received from [facility]   Social Isolation    How often do you see or talk to people that you care about and feel close to?: Not on file  Intimate Partner Violence: Not on file    FAMILY HISTORY: Family History  Problem Relation Age of Onset   Breast cancer Mother    Lung cancer Father    Other Sister    Parkinson's disease Brother     ALLERGIES:  is allergic to clindamycin.  MEDICATIONS:  Current Outpatient Medications  Medication Sig Dispense Refill   Biotin 5000 MCG TABS Take 5,000 mcg by mouth in the morning.     celecoxib (CELEBREX) 200 MG capsule Take by mouth daily.     ferrous sulfate 325 (65 FE) MG tablet Take 325 mg by mouth daily with breakfast.     meloxicam (MOBIC) 15 MG tablet Take 15 mg by mouth.     Multiple Vitamin (MULTIVITAMIN WITH MINERALS) TABS tablet      pantoprazole (PROTONIX) 40 MG tablet Take 40 mg by mouth 2 (two) times daily.     venlafaxine XR (EFFEXOR-XR) 75 MG 24 hr capsule Take 75 mg by mouth every evening.     No current facility-administered medications for this visit.     PHYSICAL EXAMINATION: ECOG PERFORMANCE STATUS: 0 - Asymptomatic Vitals:   11/17/24 1359 11/17/24 1407  BP: (!) 197/94 (!) 173/82  Pulse: 67   Resp: 18   Temp: (!) 96.4 F (35.8 C)   SpO2: 100%    Filed Weights   11/17/24 1359  Weight: 166 lb 9.6 oz (75.6 kg)    Physical Exam Constitutional:      General: She is not in acute distress. HENT:     Head: Normocephalic and atraumatic.  Eyes:     General: No scleral icterus. Cardiovascular:     Rate and Rhythm: Normal rate and regular rhythm.  Pulmonary:     Effort: Pulmonary effort is normal. No respiratory  distress.  Abdominal:     General: Bowel sounds are normal. There is no distension.     Palpations: Abdomen is soft.  Musculoskeletal:        General: No deformity. Normal range of motion.     Cervical back: Normal range of motion and neck supple.  Skin:    Findings: No erythema  or rash.  Neurological:     Mental Status: She is alert and oriented to person, place, and time. Mental status is at baseline.  Psychiatric:        Mood and Affect: Mood normal.   Breast exam is performed in seated and lying down position. Patient is status post bilateral mastectomy with reconstruction. The implant edges are intact and there is no evidence of any chest wall recurrence. No evidence of bilateral axillary adenopathy     LABORATORY DATA:  I have reviewed the data as listed    Latest Ref Rng & Units 11/17/2024    1:36 PM 11/12/2023    1:45 PM 01/06/2023   10:55 AM  CBC  WBC 4.0 - 10.5 K/uL 3.3  3.2  10.9   Hemoglobin 12.0 - 15.0 g/dL 86.3  86.8  86.8   Hematocrit 36.0 - 46.0 % 40.5  40.0  40.3   Platelets 150 - 400 K/uL 187  167  207       Latest Ref Rng & Units 11/17/2024    1:36 PM 11/12/2023    1:45 PM 01/06/2023   10:55 AM  CMP  Glucose 70 - 99 mg/dL 94  899  892   BUN 8 - 23 mg/dL 22  16  18    Creatinine 0.44 - 1.00 mg/dL 9.04  9.01  9.05   Sodium 135 - 145 mmol/L 140  139  133   Potassium 3.5 - 5.1 mmol/L 4.6  4.3  3.8   Chloride 98 - 111 mmol/L 105  107  101   CO2 22 - 32 mmol/L 26  24  20    Calcium 8.9 - 10.3 mg/dL 89.7  9.5  9.4   Total Protein 6.5 - 8.1 g/dL 7.4  6.9  7.7   Total Bilirubin 0.0 - 1.2 mg/dL 0.3  0.7  1.1   Alkaline Phos 38 - 126 U/L 98  80  81   AST 15 - 41 U/L 26  26  40   ALT 0 - 44 U/L 17  17  31       Iron/TIBC/Ferritin/ %Sat    Component Value Date/Time   IRON 98 11/17/2024 1336   TIBC 337 11/17/2024 1336   FERRITIN 88 11/17/2024 1336   IRONPCTSAT 29 11/17/2024 1336      RADIOGRAPHIC STUDIES: I have personally reviewed the radiological images as listed and agreed with the findings in the report. No results found.

## 2024-11-18 LAB — KAPPA/LAMBDA LIGHT CHAINS
Kappa free light chain: 23.5 mg/L — ABNORMAL HIGH (ref 3.3–19.4)
Kappa, lambda light chain ratio: 1.08 (ref 0.26–1.65)
Lambda free light chains: 21.8 mg/L (ref 5.7–26.3)

## 2024-11-19 LAB — COMP PANEL: LEUKEMIA/LYMPHOMA: Immunophenotypic Profile: 2

## 2024-11-19 LAB — MULTIPLE MYELOMA PANEL, SERUM
Albumin SerPl Elph-Mcnc: 4.1 g/dL (ref 2.9–4.4)
Albumin/Glob SerPl: 1.3 (ref 0.7–1.7)
Alpha 1: 0.2 g/dL (ref 0.0–0.4)
Alpha2 Glob SerPl Elph-Mcnc: 0.7 g/dL (ref 0.4–1.0)
B-Globulin SerPl Elph-Mcnc: 1 g/dL (ref 0.7–1.3)
Gamma Glob SerPl Elph-Mcnc: 1.2 g/dL (ref 0.4–1.8)
Globulin, Total: 3.2 g/dL (ref 2.2–3.9)
IgA: 253 mg/dL (ref 64–422)
IgG (Immunoglobin G), Serum: 1263 mg/dL (ref 586–1602)
IgM (Immunoglobulin M), Srm: 116 mg/dL (ref 26–217)
Total Protein ELP: 7.3 g/dL (ref 6.0–8.5)

## 2024-11-30 ENCOUNTER — Ambulatory Visit: Payer: Self-pay | Admitting: Oncology

## 2024-12-01 NOTE — Progress Notes (Signed)
 Called and spoke to pt regarding starting oral B12 1000 mcg daily. Pt verbalized understanding. Call was transferred to [facility] and follow up was scheduled.

## 2024-12-28 ENCOUNTER — Other Ambulatory Visit: Payer: Self-pay

## 2024-12-28 DIAGNOSIS — C50911 Malignant neoplasm of unspecified site of right female breast: Secondary | ICD-10-CM

## 2024-12-29 ENCOUNTER — Inpatient Hospital Stay: Admitting: Oncology

## 2024-12-29 ENCOUNTER — Inpatient Hospital Stay: Attending: Oncology

## 2024-12-29 ENCOUNTER — Encounter: Payer: Self-pay | Admitting: Oncology

## 2024-12-29 VITALS — BP 129/83 | HR 70 | Temp 96.0°F | Resp 18 | Wt 162.4 lb

## 2024-12-29 DIAGNOSIS — R7989 Other specified abnormal findings of blood chemistry: Secondary | ICD-10-CM

## 2024-12-29 DIAGNOSIS — Z803 Family history of malignant neoplasm of breast: Secondary | ICD-10-CM | POA: Diagnosis not present

## 2024-12-29 DIAGNOSIS — D72819 Decreased white blood cell count, unspecified: Secondary | ICD-10-CM

## 2024-12-29 DIAGNOSIS — D509 Iron deficiency anemia, unspecified: Secondary | ICD-10-CM | POA: Diagnosis not present

## 2024-12-29 DIAGNOSIS — Z87891 Personal history of nicotine dependence: Secondary | ICD-10-CM | POA: Diagnosis not present

## 2024-12-29 DIAGNOSIS — D72820 Lymphocytosis (symptomatic): Secondary | ICD-10-CM | POA: Insufficient documentation

## 2024-12-29 DIAGNOSIS — Z801 Family history of malignant neoplasm of trachea, bronchus and lung: Secondary | ICD-10-CM | POA: Diagnosis not present

## 2024-12-29 DIAGNOSIS — Z800 Family history of malignant neoplasm of digestive organs: Secondary | ICD-10-CM | POA: Insufficient documentation

## 2024-12-29 DIAGNOSIS — Z79899 Other long term (current) drug therapy: Secondary | ICD-10-CM | POA: Diagnosis not present

## 2024-12-29 DIAGNOSIS — Z170 Estrogen receptor positive status [ER+]: Secondary | ICD-10-CM | POA: Diagnosis not present

## 2024-12-29 DIAGNOSIS — Z9013 Acquired absence of bilateral breasts and nipples: Secondary | ICD-10-CM | POA: Diagnosis not present

## 2024-12-29 DIAGNOSIS — Z1721 Progesterone receptor positive status: Secondary | ICD-10-CM | POA: Insufficient documentation

## 2024-12-29 DIAGNOSIS — D709 Neutropenia, unspecified: Secondary | ICD-10-CM | POA: Insufficient documentation

## 2024-12-29 DIAGNOSIS — C50911 Malignant neoplasm of unspecified site of right female breast: Secondary | ICD-10-CM

## 2024-12-29 DIAGNOSIS — Z1732 Human epidermal growth factor receptor 2 negative status: Secondary | ICD-10-CM | POA: Diagnosis not present

## 2024-12-29 DIAGNOSIS — C50511 Malignant neoplasm of lower-outer quadrant of right female breast: Secondary | ICD-10-CM | POA: Diagnosis present

## 2024-12-29 LAB — CBC WITH DIFFERENTIAL (CANCER CENTER ONLY)
Abs Immature Granulocytes: 0.01 K/uL (ref 0.00–0.07)
Basophils Absolute: 0.1 K/uL (ref 0.0–0.1)
Basophils Relative: 2 %
Eosinophils Absolute: 0.1 K/uL (ref 0.0–0.5)
Eosinophils Relative: 3 %
HCT: 41.2 % (ref 36.0–46.0)
Hemoglobin: 13.8 g/dL (ref 12.0–15.0)
Immature Granulocytes: 0 %
Lymphocytes Relative: 36 %
Lymphs Abs: 1.1 K/uL (ref 0.7–4.0)
MCH: 29.9 pg (ref 26.0–34.0)
MCHC: 33.5 g/dL (ref 30.0–36.0)
MCV: 89.4 fL (ref 80.0–100.0)
Monocytes Absolute: 0.3 K/uL (ref 0.1–1.0)
Monocytes Relative: 12 %
Neutro Abs: 1.4 K/uL — ABNORMAL LOW (ref 1.7–7.7)
Neutrophils Relative %: 47 %
Platelet Count: 177 K/uL (ref 150–400)
RBC: 4.61 MIL/uL (ref 3.87–5.11)
RDW: 12.6 % (ref 11.5–15.5)
WBC Count: 2.9 K/uL — ABNORMAL LOW (ref 4.0–10.5)
nRBC: 0 % (ref 0.0–0.2)

## 2024-12-29 NOTE — Assessment & Plan Note (Addendum)
 Low normal end B12 may contribute to neutropenia.  Peripheral flowcytometry showed Increased gamma-delta large granular lymphocytes, 15% of leukocytes, <1000/uL, this can be associated with rheumatoid arthritis and other autoimmune disorders, connective tissue disorders, myelodysplasia, and various  hematopoietic and nonhematopoietic neoplasms. 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  Observation  check T cell receptor rearrangement, ANA, ANCA

## 2024-12-29 NOTE — Assessment & Plan Note (Signed)
 Peripheral blood flowcytometry showed CD5 dim, FMC7 positive, CD23 dim monoclonal B cell population  detected, positive for CD19, CD20 and CD22, negative for CD38, representing 2% of leukocytes, <5,000/uL  She does not have any constitutional symptoms.  Recommend observation.

## 2024-12-29 NOTE — Progress Notes (Signed)
 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  Status post bilateral mastectomy with reconstruction.  Final mastectomy pathology was not available to me. Declined endocrine therapy. Continue surveillance.   Neutropenia Low normal end B12 may contribute to neutropenia.  Peripheral flowcytometry showed Increased gamma-delta large granular lymphocytes, 15% of leukocytes, <1000/uL, this can be associated with rheumatoid arthritis and other autoimmune disorders, connective tissue disorders, myelodysplasia, and various  hematopoietic and nonhematopoietic neoplasms. 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  Observation  check T cell receptor rearrangement, ANA, ANCA  Monoclonal B-cell lymphocytosis of undetermined significance Peripheral blood flowcytometry showed CD5 dim, FMC7 positive, CD23 dim monoclonal B cell population  detected, positive for CD19, CD20 and CD22, negative for CD38, representing 2% of leukocytes, <5,000/uL  She does not have any constitutional symptoms.  Recommend observation.   Iron deficiency anemia Lab Results  Component Value Date   HGB 13.8 12/29/2024   TIBC 337 11/17/2024   IRONPCTSAT 29 11/17/2024   FERRITIN 88 11/17/2024   S/p EGD, colonoscopy and capsule study results are not available to me.  Normalized hemoglobin and iron panel. Stop oral iron supplementation.   Orders Placed This Encounter  Procedures   Vitamin B12    Standing Status:   Future    Expected Date:   04/28/2025    Expiration Date:   12/29/2025   CBC with Differential/Platelet    Standing Status:   Future    Expected Date:   04/28/2025     Expiration Date:   07/27/2025   ANA, IFA (with reflex)    Standing Status:   Future    Expected Date:   04/28/2025    Expiration Date:   07/27/2025   ANCA TITERS    Standing Status:   Future    Expected Date:   04/28/2025    Expiration Date:   07/27/2025   Miscellaneous [facility] test (send-out)    Standing Status:   Future    Expected Date:   04/28/2025    Expiration Date:   07/27/2025    Test name / description::   T cell receptor gene rearrangement [facility] [id number]   Follow up in 6 months.  All questions were answered. The patient knows to call the clinic with any problems, questions or concerns.  Call Babara, MD, PhD [facility] Hematology Oncology 12/29/2024   CHIEF COMPLAINTS/REASON FOR VISIT:  history of right breast cancer, iron deficiency anemia  HISTORY OF PRESENTING ILLNESS:   Janice Sanford is a  74 y.o.  female with PMH listed below was seen in consultation at the request of  Sherial Bail, MD  for evaluation of right breast cancer  Patient lives in both [place]  and [place] . Previous oncology care was in [place] .  Reviewed pathology report of her right breast biopsy in May 2022. 04/24/2021, Site 1, 6:00 to 7:00 right breast, atypical ductal hyperplasia.  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 ER 90% positive, PR 90% positive, HER2 IHC negative  Patient has elected to have bilateral mastectomy.  Final mastectomy pathology report was not available to me. Patient has declined  adjuvant endocrine therapy.  Menarche 74 years of age, Age of first childbirth Patient has history of hormone replacement therapy for 10+ years.  Currently off. OCP use, remote Denies any chest wall radiation.  Family history is positive for mother who was diagnosed with breast cancer in her 64s, father was diagnosed with lung cancer. No other cancer family  history.  03/13/2022 she had bilateral revisions of reconstructed breasts with exchange of TE for saline implants She also had skin biopsy of the right anterior shoulder and skin of the right upper back.  Pathology showed actinic keratosis, bowenoid, no malignancy in the sections examined.  She is now retired and resides primarily in [place] .   INTERVAL HISTORY Samyria Rudie is a 74 y.o. female who has above history reviewed by me today presents for follow up visit for management of history of right breast cancer.  Sleep disturbance. + hot flash  which wakes up at night.     Review of Systems  Constitutional:  Negative for appetite change, chills, fatigue and fever.  HENT:   Negative for hearing loss and voice change.   Eyes:  Negative for eye problems.  Respiratory:  Negative for chest tightness and cough.   Cardiovascular:  Negative for chest pain.  Gastrointestinal:  Negative for abdominal distention, abdominal pain and blood in stool.  Endocrine: Positive for hot flashes.  Genitourinary:  Negative for difficulty urinating and frequency.   Musculoskeletal:  Positive for arthralgias.       Chronic knee pain  Skin:  Negative for itching and rash.  Neurological:  Negative for extremity weakness.  Hematological:  Negative for adenopathy.  Psychiatric/Behavioral:  Positive for sleep disturbance. Negative for confusion.     MEDICAL HISTORY:  Past Medical History:  Diagnosis Date   Anemia    Arthritis    Breast cancer (HCC)    Cataract    Complete tear of right rotator cuff 11/2023   Depression    Dysrhythmia    Family history of breast cancer    Family history of lung cancer    Family history of stomach cancer    GERD (gastroesophageal reflux disease)    Irregular heart beat    S/P bilateral breast lumpectomy 07/2021   Ventricular bigeminy     SURGICAL HISTORY: Past Surgical History:  Procedure Laterality Date   ARTHOSCOPIC ROTAOR CUFF REPAIR Right 12/16/2023    Procedure: Right shoulder arthroscopic rotator cuff repair, subacromial decompression, distal clavicle excision, and biceps tenodesis;  Surgeon: Tobie Priest, MD;  Location: [facility] ORS;  Service: Orthopedics;  Laterality: Right;   CESAREAN SECTION     3 c -section   EYE SURGERY     HERNIA REPAIR     2005 and 2013   meniscus repair rt knee     x 5, left knee x 2   REPOSITION OF LENS  2019   ROTATOR CUFF REPAIR     2010 & 2012   TONSILLECTOMY     as a child    SOCIAL HISTORY: Social History   Socioeconomic History   Marital status: Married    Spouse name: Ship Broker (Spouse)   Number of children: Not on file   Years of education: Not on file   Highest education level: Not on file  Occupational History   Not on file  Tobacco Use   Smoking status: Former    Current packs/day: 0.00    Types: Cigarettes    Quit date: 1984    Years since quitting:  42.0   Smokeless tobacco: Former  Substance and Sexual Activity   Alcohol use: Yes    Alcohol/week: 1.0 standard drink of alcohol    Types: 1 Standard drinks or equivalent per week    Comment: ocassional   Drug use: Not Currently   Sexual activity: Yes  Other Topics Concern   Not on file  Social History Narrative   Not on file   Social Drivers of Health   Tobacco Use: Medium Risk (12/29/2024)   Patient History    Smoking Tobacco Use: Former    Smokeless Tobacco Use: Former    Passive Exposure: Not on Actuary Strain: Low Risk  (09/13/2024)   Received from [facility]   Overall Financial Resource Strain (CARDIA)    Difficulty of Paying Living Expenses: Not hard at all  Food Insecurity: No Food Insecurity (09/20/2024)   Received from [facility]   Epic    Within the past 12 months, you worried that your food would run out before you got the money to buy more.: Never true    Within the past 12 months, the food you bought just didn't last and you didn't have money to get  more.: Never true  Transportation Needs: No Transportation Needs (09/20/2024)   Received from [facility] - Transportation    In the past 12 months, has lack of transportation kept you from medical appointments or from getting medications?: No    Lack of Transportation (Non-Medical): No  Physical Activity: Not on file  Stress: Not on file  Social Connections: Unknown (02/08/2022)   Received from [facility]   Social Isolation    How often do you see or talk to people that you care about and feel close to?: Not on file  Intimate Partner Violence: Not on file  Depression (EYV7-0): Not on file  Alcohol Screen: Not on file  Housing: Low Risk  (09/20/2024)   Received from [facility]   Epic    In the last 12 months, was there a time when you were not able to pay the mortgage or rent on time?: No    In the past 12 months, how many times have you moved where you were living?: 0    At any time in the past 12 months, were you homeless or living in a shelter (including now)?: No  Utilities: Not At Risk (09/20/2024)   Received from [facility]   Epic    In the past 12 months has the electric, gas, oil, or water company threatened to shut off services in your home?: No  Health Literacy: Not on file    FAMILY HISTORY: Family History  Problem Relation Age of Onset   Breast cancer Mother    Lung cancer Father    Other Sister    Parkinson's disease Brother     ALLERGIES:  is allergic to clindamycin.  MEDICATIONS:  Current Outpatient Medications  Medication Sig Dispense Refill   Biotin 5000 MCG TABS Take 5,000 mcg by mouth in the morning.     celecoxib (CELEBREX) 200 MG capsule Take by mouth daily.     cyanocobalamin  (VITAMIN B12) 1000 MCG tablet Take 1,000 mcg by mouth daily.     meloxicam (MOBIC) 15 MG tablet Take 15 mg by mouth.     Multiple Vitamin (MULTIVITAMIN WITH MINERALS) TABS tablet      pantoprazole (PROTONIX)  40 MG tablet  Take 40 mg by mouth 2 (two) times daily.     venlafaxine XR (EFFEXOR-XR) 75 MG 24 hr capsule Take 75 mg by mouth every evening.     ferrous sulfate 325 (65 FE) MG tablet Take 325 mg by mouth daily with breakfast. (Patient not taking: Reported on 12/29/2024)     No current facility-administered medications for this visit.     PHYSICAL EXAMINATION: ECOG PERFORMANCE STATUS: 0 - Asymptomatic Vitals:   12/29/24 1102 12/29/24 1106  BP: (!) 146/89 129/83  Pulse: 70   Resp: 18   Temp: (!) 96 F (35.6 C)   SpO2: 100%    Filed Weights   12/29/24 1102  Weight: 162 lb 6.4 oz (73.7 kg)    Physical Exam Constitutional:      General: She is not in acute distress.    Appearance: She is not diaphoretic.  HENT:     Head: Normocephalic and atraumatic.  Eyes:     General: No scleral icterus. Cardiovascular:     Rate and Rhythm: Normal rate and regular rhythm.  Pulmonary:     Effort: Pulmonary effort is normal. No respiratory distress.     Breath sounds: Normal breath sounds. No wheezing.  Abdominal:     General: There is no distension.     Tenderness: There is no abdominal tenderness.  Musculoskeletal:        General: Normal range of motion.     Cervical back: Normal range of motion and neck supple.  Skin:    Findings: No erythema.  Neurological:     Mental Status: She is alert. Mental status is at baseline.     Motor: No abnormal muscle tone.  Psychiatric:        Mood and Affect: Mood and affect normal.       LABORATORY DATA:  I have reviewed the data as listed    Latest Ref Rng & Units 12/29/2024   10:31 AM 11/17/2024    1:36 PM 11/12/2023    1:45 PM  CBC  WBC 4.0 - 10.5 K/uL 2.9  3.3  3.2   Hemoglobin 12.0 - 15.0 g/dL 86.1  86.3  86.8   Hematocrit 36.0 - 46.0 % 41.2  40.5  40.0   Platelets 150 - 400 K/uL 177  187  167       Latest Ref Rng & Units 11/17/2024    1:36 PM 11/12/2023    1:45 PM 01/06/2023   10:55 AM  CMP  Glucose 70 - 99 mg/dL 94  899  892    BUN 8 - 23 mg/dL 22  16  18    Creatinine 0.44 - 1.00 mg/dL 9.04  9.01  9.05   Sodium 135 - 145 mmol/L 140  139  133   Potassium 3.5 - 5.1 mmol/L 4.6  4.3  3.8   Chloride 98 - 111 mmol/L 105  107  101   CO2 22 - 32 mmol/L 26  24  20    Calcium 8.9 - 10.3 mg/dL 89.7  9.5  9.4   Total Protein 6.5 - 8.1 g/dL 7.4  6.9  7.7   Total Bilirubin 0.0 - 1.2 mg/dL 0.3  0.7  1.1   Alkaline Phos 38 - 126 U/L 98  80  81   AST 15 - 41 U/L 26  26  40   ALT 0 - 44 U/L 17  17  31       Iron/TIBC/Ferritin/ %Sat    Component Value Date/Time   IRON  98 11/17/2024 1336   TIBC 337 11/17/2024 1336   FERRITIN 88 11/17/2024 1336   IRONPCTSAT 29 11/17/2024 1336      RADIOGRAPHIC STUDIES: I have personally reviewed the radiological images as listed and agreed with the findings in the report. No results found.   "

## 2024-12-29 NOTE — Assessment & Plan Note (Signed)
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  Status post bilateral mastectomy with reconstruction.  Final mastectomy pathology was not available to me. Declined endocrine therapy. Continue surveillance.

## 2024-12-29 NOTE — Assessment & Plan Note (Signed)
 Lab Results  Component Value Date   HGB 13.8 12/29/2024   TIBC 337 11/17/2024   IRONPCTSAT 29 11/17/2024   FERRITIN 88 11/17/2024   S/p EGD, colonoscopy and capsule study results are not available to me.  Normalized hemoglobin and iron panel. Stop oral iron supplementation.

## 2025-04-15 ENCOUNTER — Ambulatory Visit: Admitting: Plastic Surgery

## 2025-04-28 ENCOUNTER — Inpatient Hospital Stay

## 2025-05-05 ENCOUNTER — Inpatient Hospital Stay

## 2025-05-05 ENCOUNTER — Inpatient Hospital Stay: Admitting: Oncology
